# Patient Record
Sex: Female | Born: 1955 | Race: White | Hispanic: No | Marital: Single | Smoking: Former smoker
Health system: Southern US, Community
[De-identification: ages and names within clinical notes are randomized; demographics above are authoritative.]

## PROBLEM LIST (undated history)

## (undated) DIAGNOSIS — J189 Pneumonia, unspecified organism: Secondary | ICD-10-CM

## (undated) DIAGNOSIS — R42 Dizziness and giddiness: Secondary | ICD-10-CM

## (undated) DIAGNOSIS — J449 Chronic obstructive pulmonary disease, unspecified: Secondary | ICD-10-CM

## (undated) DIAGNOSIS — E785 Hyperlipidemia, unspecified: Secondary | ICD-10-CM

## (undated) DIAGNOSIS — S5292XA Unspecified fracture of left forearm, initial encounter for closed fracture: Secondary | ICD-10-CM

## (undated) DIAGNOSIS — E119 Type 2 diabetes mellitus without complications: Secondary | ICD-10-CM

## (undated) DIAGNOSIS — I10 Essential (primary) hypertension: Secondary | ICD-10-CM

## (undated) DIAGNOSIS — M1990 Unspecified osteoarthritis, unspecified site: Secondary | ICD-10-CM

## (undated) DIAGNOSIS — J40 Bronchitis, not specified as acute or chronic: Secondary | ICD-10-CM

## (undated) DIAGNOSIS — C801 Malignant (primary) neoplasm, unspecified: Secondary | ICD-10-CM

## (undated) DIAGNOSIS — E8801 Alpha-1-antitrypsin deficiency: Secondary | ICD-10-CM

## (undated) HISTORY — DX: Alpha-1-antitrypsin deficiency: E88.01

## (undated) HISTORY — PX: BREAST EXCISIONAL BIOPSY: SUR124

## (undated) HISTORY — DX: Malignant (primary) neoplasm, unspecified: C80.1

## (undated) HISTORY — PX: DILATION AND CURETTAGE OF UTERUS: SHX78

---

## 1998-03-27 HISTORY — PX: CARDIAC CATHETERIZATION: SHX172

## 2000-03-27 HISTORY — PX: LUNG TRANSPLANT, DOUBLE: SHX704

## 2000-06-22 DIAGNOSIS — Z942 Lung transplant status: Secondary | ICD-10-CM | POA: Insufficient documentation

## 2011-05-26 ENCOUNTER — Encounter (HOSPITAL_COMMUNITY): Payer: Self-pay | Admitting: Respiratory Therapy

## 2011-05-26 HISTORY — PX: WRIST SURGERY: SHX841

## 2011-05-29 NOTE — Pre-Procedure Instructions (Signed)
20 Krista Ellison  05/29/2011   Your procedure is scheduled on: Thursday June 01, 2011 at 2:00 pm.  Report to [facility] at  1200 PM.  Call this number if you have problems the morning of surgery: [phone]   Remember:   Do not eat food:After Midnight.  May have clear liquids: up to 4 Hours before arrival.  Clear liquids include soda, tea, black coffee, apple or grape juice, broth.  Take these medicines the morning of surgery with A SIP OF WATER: Zovirax & Bactrim   Do not wear jewelry, make-up or nail polish.  Do not wear lotions, powders, or perfumes. You may wear deodorant.  Do not shave 48 hours prior to surgery.  Do not bring valuables to the hospital.  Contacts, dentures or bridgework may not be worn into surgery.  Leave suitcase in the car. After surgery it may be brought to your room.  For patients admitted to the hospital, checkout time is 11:00 AM the day of discharge.   Patients discharged the day of surgery will not be allowed to drive home.  Name and phone number of your driver:   Special Instructions: CHG Shower Use Special Wash: 1/2 bottle night before surgery and 1/2 bottle morning of surgery.   Please read over the following fact sheets that you were given: Pain Booklet, Coughing and Deep Breathing and Surgical Site Infection Prevention

## 2011-05-29 NOTE — Progress Notes (Signed)
Dr. Glenna Durand office called and informed that pt did not have any orders for tomorrow's PAT visit.

## 2011-05-30 ENCOUNTER — Encounter (HOSPITAL_COMMUNITY): Payer: Self-pay

## 2011-05-30 ENCOUNTER — Other Ambulatory Visit: Payer: Self-pay

## 2011-05-30 ENCOUNTER — Ambulatory Visit (HOSPITAL_COMMUNITY)
Admission: RE | Admit: 2011-05-30 | Discharge: 2011-05-30 | Disposition: A | Discharge status: Home / Self Care | Payer: Medicare Other | Source: Ambulatory Visit | Attending: Anesthesiology | Admitting: Anesthesiology

## 2011-05-30 ENCOUNTER — Encounter (HOSPITAL_COMMUNITY)
Admission: RE | Admit: 2011-05-30 | Discharge: 2011-05-30 | Disposition: A | Discharge status: Home / Self Care | Payer: Medicare Other | Source: Ambulatory Visit | Attending: Orthopedic Surgery | Admitting: Orthopedic Surgery

## 2011-05-30 DIAGNOSIS — Z01818 Encounter for other preprocedural examination: Secondary | ICD-10-CM | POA: Insufficient documentation

## 2011-05-30 DIAGNOSIS — Z01810 Encounter for preprocedural cardiovascular examination: Secondary | ICD-10-CM | POA: Insufficient documentation

## 2011-05-30 DIAGNOSIS — Z01812 Encounter for preprocedural laboratory examination: Secondary | ICD-10-CM | POA: Insufficient documentation

## 2011-05-30 HISTORY — DX: Dizziness and giddiness: R42

## 2011-05-30 HISTORY — DX: Chronic obstructive pulmonary disease, unspecified: J44.9

## 2011-05-30 HISTORY — DX: Essential (primary) hypertension: I10

## 2011-05-30 HISTORY — DX: Hyperlipidemia, unspecified: E78.5

## 2011-05-30 HISTORY — DX: Bronchitis, not specified as acute or chronic: J40

## 2011-05-30 HISTORY — DX: Unspecified osteoarthritis, unspecified site: M19.90

## 2011-05-30 HISTORY — DX: Pneumonia, unspecified organism: J18.9

## 2011-05-30 LAB — CBC
HCT: 38.2 % (ref 36.0–46.0)
MCV: 89.7 fL (ref 78.0–100.0)
RBC: 4.26 MIL/uL (ref 3.87–5.11)
RDW: 13.2 % (ref 11.5–15.5)
WBC: 8.4 10*3/uL (ref 4.0–10.5)

## 2011-05-30 LAB — BASIC METABOLIC PANEL
CO2: 27 mEq/L (ref 19–32)
Chloride: 106 mEq/L (ref 96–112)
GFR calc Af Amer: 70 mL/min — ABNORMAL LOW (ref >90)
Potassium: 4.9 mEq/L (ref 3.5–5.1)
Sodium: 139 mEq/L (ref 135–145)

## 2011-05-30 LAB — SURGICAL PCR SCREEN: Staphylococcus aureus: NEGATIVE

## 2011-05-30 NOTE — Progress Notes (Addendum)
Pt informed Nurse that a stress test and cardiac cath was performed in 2000 prior to double lung transplant in [place]. Records requested. Pt also stated that she currently moved to [facility] in September and has a scheduled upcoming appointment at [facility] with a Cardiologist. Consent not signed because there were no orders from surgeon's office after two requests. Pt informed of this and instructed that consent would be signed the morning of surgery. Pt verbalized understanding.

## 2011-05-31 NOTE — Consult Note (Signed)
Anesthesia:  Patient is a 56 year old female posted for an ORIF of a left radial fracture using regional anesthesia on 06/01/11 (orders till pending).  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  She recently moved to [facility] in September 2012.  According to her PAT RN, patient is scheduled to be seen at [facility] (?Transplant Clinic) in March.  I was not asked to see this patient during her PAT visit.  Medications include acyclovir, Vit D, cyclosporine, Omega 3, lisinopril, cellcept, pravachol, prednisone, Septra 400-80, Vit C, and Vit E.  She had a normal spirometry study on 10/25/10 (see attached copy); however, there are no recent office notes.    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  Labs noted.  Will order HFP and coags for the day of surgery since she has a history of alpha-I antitrypsin deficiency.    CXR showed no active cardiopulmonary disease.  RA O2 sats 95%.  EKG shows NSR.  I have not been able to reach the patient yet this morning by telephone to further assess her functional status, but will try again later on today.  (Per her PAT nurse, the patient fell and fractured her wrist while hiking, so would presume she has a fairly good functional status.)  If she is not having any significant respiratory symptoms and additional labs results are reasonable, then anticipate she can proceed with plans for regional anesthesia.  I reviewed this case with several Anesthesiologists including Dr. Michelle Piper who agrees with the above plan but would also recommend a preoperative ABG on RA.

## 2011-05-31 NOTE — Progress Notes (Signed)
REREQUESTED ORDERS FROM DR [facility] OFFICE.

## 2011-06-01 ENCOUNTER — Ambulatory Visit (HOSPITAL_COMMUNITY)
Admission: RE | Admit: 2011-06-01 | Discharge: 2011-06-03 | Disposition: A | Discharge status: Home Health | Payer: Medicare Other | Source: Ambulatory Visit | Attending: Orthopedic Surgery | Admitting: Orthopedic Surgery

## 2011-06-01 ENCOUNTER — Encounter (HOSPITAL_COMMUNITY): Payer: Self-pay | Admitting: Vascular Surgery

## 2011-06-01 ENCOUNTER — Encounter (HOSPITAL_COMMUNITY): Payer: Self-pay | Admitting: *Deleted

## 2011-06-01 ENCOUNTER — Ambulatory Visit (HOSPITAL_COMMUNITY): Payer: Medicare Other | Admitting: Vascular Surgery

## 2011-06-01 ENCOUNTER — Encounter (HOSPITAL_COMMUNITY)
Admission: RE | Disposition: A | Discharge status: Home Health | Payer: Self-pay | Source: Ambulatory Visit | Attending: Orthopedic Surgery

## 2011-06-01 DIAGNOSIS — I10 Essential (primary) hypertension: Secondary | ICD-10-CM | POA: Insufficient documentation

## 2011-06-01 DIAGNOSIS — Z942 Lung transplant status: Secondary | ICD-10-CM | POA: Insufficient documentation

## 2011-06-01 DIAGNOSIS — W19XXXA Unspecified fall, initial encounter: Secondary | ICD-10-CM | POA: Insufficient documentation

## 2011-06-01 DIAGNOSIS — E785 Hyperlipidemia, unspecified: Secondary | ICD-10-CM | POA: Insufficient documentation

## 2011-06-01 DIAGNOSIS — J449 Chronic obstructive pulmonary disease, unspecified: Secondary | ICD-10-CM | POA: Insufficient documentation

## 2011-06-01 DIAGNOSIS — S52599A Other fractures of lower end of unspecified radius, initial encounter for closed fracture: Secondary | ICD-10-CM | POA: Insufficient documentation

## 2011-06-01 DIAGNOSIS — J4489 Other specified chronic obstructive pulmonary disease: Secondary | ICD-10-CM | POA: Insufficient documentation

## 2011-06-01 LAB — BLOOD GAS, ARTERIAL
Acid-base deficit: 2.7 mmol/L — ABNORMAL HIGH (ref 0.0–2.0)
Bicarbonate: 20.1 mEq/L (ref 20.0–24.0)
TCO2: 20.9 mmol/L (ref 0–100)
pCO2 arterial: 26.4 mmHg — ABNORMAL LOW (ref 35.0–45.0)
pH, Arterial: 7.495 — ABNORMAL HIGH (ref 7.350–7.400)

## 2011-06-01 LAB — HEPATIC FUNCTION PANEL
ALT: 11 U/L (ref 0–35)
AST: 16 U/L (ref 0–37)
Albumin: 4 g/dL (ref 3.5–5.2)
Bilirubin, Direct: 0.1 mg/dL (ref 0.0–0.3)
Total Bilirubin: 0.5 mg/dL (ref 0.3–1.2)

## 2011-06-01 LAB — PROTIME-INR: INR: 0.9 (ref 0.00–1.49)

## 2011-06-01 SURGERY — OPEN REDUCTION INTERNAL FIXATION (ORIF) DISTAL RADIUS FRACTURE
Anesthesia: General | Site: Arm Lower | Laterality: Left | Wound class: Clean

## 2011-06-01 MED ORDER — MORPHINE SULFATE 2 MG/ML IJ SOLN: 0.0500 mg/kg | INTRAMUSCULAR | Status: DC | PRN

## 2011-06-01 MED ORDER — MIDAZOLAM HCL 2 MG/2ML IJ SOLN
1.0000 mg | INTRAMUSCULAR | Status: DC | PRN
  Administered 2011-06-01: 2 mg via INTRAVENOUS

## 2011-06-01 MED ORDER — VITAMIN C 500 MG PO TABS
1000.0000 mg | ORAL_TABLET | Freq: Every day | ORAL | Status: DC
  Administered 2011-06-02 – 2011-06-03 (×2): 1000 mg via ORAL
  Filled 2011-06-01 (×3): qty 2

## 2011-06-01 MED ORDER — DOCUSATE SODIUM 100 MG PO CAPS: 100.0000 mg | ORAL_CAPSULE | Freq: Two times a day (BID) | ORAL | Status: AC

## 2011-06-01 MED ORDER — LACTATED RINGERS IV SOLN
INTRAVENOUS | Status: DC
  Administered 2011-06-01: 14:00:00 via INTRAVENOUS

## 2011-06-01 MED ORDER — CEFAZOLIN SODIUM 1-5 GM-% IV SOLN
1.0000 g | Freq: Three times a day (TID) | INTRAVENOUS | Status: AC
  Administered 2011-06-02 (×3): 1 g via INTRAVENOUS
  Filled 2011-06-01 (×3): qty 50

## 2011-06-01 MED ORDER — LIDOCAINE HCL (CARDIAC) 20 MG/ML IV SOLN
INTRAVENOUS | Status: DC | PRN
  Administered 2011-06-01: 60 mg via INTRAVENOUS

## 2011-06-01 MED ORDER — ONDANSETRON HCL 4 MG/2ML IJ SOLN
INTRAMUSCULAR | Status: DC | PRN
  Administered 2011-06-01: 4 mg via INTRAVENOUS

## 2011-06-01 MED ORDER — VITAMIN D3 25 MCG (1000 UNIT) PO TABS
2000.0000 [IU] | ORAL_TABLET | Freq: Every day | ORAL | Status: DC
  Administered 2011-06-02 – 2011-06-03 (×2): 2000 [IU] via ORAL
  Filled 2011-06-01 (×3): qty 2

## 2011-06-01 MED ORDER — DOCUSATE SODIUM 100 MG PO CAPS
100.0000 mg | ORAL_CAPSULE | Freq: Two times a day (BID) | ORAL | Status: DC
  Administered 2011-06-02 – 2011-06-03 (×4): 100 mg via ORAL
  Filled 2011-06-01 (×5): qty 1

## 2011-06-01 MED ORDER — SULFAMETHOXAZOLE-TRIMETHOPRIM 400-80 MG PO TABS
1.0000 | ORAL_TABLET | ORAL | Status: DC
  Administered 2011-06-02: 1 via ORAL
  Filled 2011-06-01: qty 1

## 2011-06-01 MED ORDER — MORPHINE SULFATE 2 MG/ML IJ SOLN
1.0000 mg | INTRAMUSCULAR | Status: DC | PRN
  Administered 2011-06-02 (×4): 1 mg via INTRAVENOUS
  Filled 2011-06-01 (×4): qty 1

## 2011-06-01 MED ORDER — METHOCARBAMOL 500 MG PO TABS: 500.0000 mg | ORAL_TABLET | Freq: Four times a day (QID) | ORAL | Status: AC

## 2011-06-01 MED ORDER — MYCOPHENOLATE MOFETIL 500 MG PO TABS
1500.0000 mg | ORAL_TABLET | Freq: Two times a day (BID) | ORAL | Status: DC
  Administered 2011-06-02: 1500 mg via ORAL
  Filled 2011-06-01 (×7): qty 3

## 2011-06-01 MED ORDER — POTASSIUM CL IN DEXTROSE 5% 20 MEQ/L IV SOLN
20.0000 meq | INTRAVENOUS | Status: DC
  Administered 2011-06-02 (×2): 20 meq via INTRAVENOUS
  Filled 2011-06-01 (×4): qty 1000

## 2011-06-01 MED ORDER — LISINOPRIL 10 MG PO TABS
10.0000 mg | ORAL_TABLET | Freq: Every day | ORAL | Status: DC
  Administered 2011-06-01 – 2011-06-03 (×3): 10 mg via ORAL
  Filled 2011-06-01 (×3): qty 1

## 2011-06-01 MED ORDER — ONDANSETRON HCL 4 MG PO TABS: 4.0000 mg | ORAL_TABLET | Freq: Four times a day (QID) | ORAL | Status: DC | PRN

## 2011-06-01 MED ORDER — FENTANYL CITRATE 0.05 MG/ML IJ SOLN
INTRAMUSCULAR | Status: AC
  Filled 2011-06-01: qty 2

## 2011-06-01 MED ORDER — CEFAZOLIN SODIUM 1-5 GM-% IV SOLN
1.0000 g | INTRAVENOUS | Status: AC
  Administered 2011-06-01: 1 g via INTRAVENOUS
  Filled 2011-06-01: qty 50

## 2011-06-01 MED ORDER — HYDROCODONE-ACETAMINOPHEN 5-325 MG PO TABS
1.0000 | ORAL_TABLET | ORAL | Status: DC | PRN
  Administered 2011-06-03 (×2): 2 via ORAL
  Filled 2011-06-01 (×2): qty 2

## 2011-06-01 MED ORDER — CEFAZOLIN SODIUM 1-5 GM-% IV SOLN
INTRAVENOUS | Status: AC
  Administered 2011-06-02: 1 g via INTRAVENOUS
  Filled 2011-06-01: qty 50

## 2011-06-01 MED ORDER — CYCLOSPORINE MODIFIED 50 MG PO CAPS (GENGRAF ONLY)
150.0000 mg | Freq: Two times a day (BID) | ORAL | Status: DC
  Administered 2011-06-01 – 2011-06-03 (×5): 150 mg via ORAL
  Filled 2011-06-01 (×7): qty 3

## 2011-06-01 MED ORDER — OXYCODONE-ACETAMINOPHEN 5-325 MG PO TABS: 1.0000 | ORAL_TABLET | ORAL | Status: AC | PRN

## 2011-06-01 MED ORDER — HYDROCORTISONE SOD SUCCINATE 100 MG PF FOR IT USE
INTRAMUSCULAR | Status: DC | PRN
  Administered 2011-06-01: 100 mg via INTRATHECAL

## 2011-06-01 MED ORDER — SIMVASTATIN 5 MG PO TABS
5.0000 mg | ORAL_TABLET | Freq: Every day | ORAL | Status: DC
  Administered 2011-06-02: 5 mg via ORAL
  Filled 2011-06-01 (×3): qty 1

## 2011-06-01 MED ORDER — ONDANSETRON HCL 4 MG/2ML IJ SOLN: 4.0000 mg | Freq: Once | INTRAMUSCULAR | Status: DC | PRN

## 2011-06-01 MED ORDER — CEFAZOLIN SODIUM 1-5 GM-% IV SOLN: 1.0000 g | Freq: Three times a day (TID) | INTRAVENOUS | Status: DC

## 2011-06-01 MED ORDER — FENTANYL CITRATE 0.05 MG/ML IJ SOLN
INTRAMUSCULAR | Status: DC | PRN
  Administered 2011-06-01: 50 ug via INTRAVENOUS
  Administered 2011-06-01: 100 ug via INTRAVENOUS
  Administered 2011-06-01: 50 ug via INTRAVENOUS

## 2011-06-01 MED ORDER — LACTATED RINGERS IV SOLN
INTRAVENOUS | Status: DC | PRN
  Administered 2011-06-01 (×2): via INTRAVENOUS

## 2011-06-01 MED ORDER — MIDAZOLAM HCL 2 MG/2ML IJ SOLN
INTRAMUSCULAR | Status: AC
  Filled 2011-06-01: qty 2

## 2011-06-01 MED ORDER — METHOCARBAMOL 100 MG/ML IJ SOLN
500.0000 mg | Freq: Four times a day (QID) | INTRAVENOUS | Status: DC | PRN
  Filled 2011-06-01: qty 5

## 2011-06-01 MED ORDER — ONDANSETRON HCL 4 MG/2ML IJ SOLN
4.0000 mg | Freq: Four times a day (QID) | INTRAMUSCULAR | Status: DC | PRN
  Administered 2011-06-02: 4 mg via INTRAVENOUS
  Filled 2011-06-01: qty 2

## 2011-06-01 MED ORDER — VITAMIN E 180 MG (400 UNIT) PO CAPS
400.0000 [IU] | ORAL_CAPSULE | Freq: Every day | ORAL | Status: DC
  Administered 2011-06-02 – 2011-06-03 (×2): 400 [IU] via ORAL
  Filled 2011-06-01 (×3): qty 1

## 2011-06-01 MED ORDER — METHOCARBAMOL 100 MG/ML IJ SOLN
500.0000 mg | INTRAMUSCULAR | Status: AC
  Administered 2011-06-01: 500 mg via INTRAVENOUS
  Filled 2011-06-01: qty 5

## 2011-06-01 MED ORDER — CEFAZOLIN SODIUM 1-5 GM-% IV SOLN
1.0000 g | INTRAVENOUS | Status: AC
  Administered 2011-06-01: 1 g via INTRAVENOUS

## 2011-06-01 MED ORDER — VITAMIN C 500 MG PO TABS
500.0000 mg | ORAL_TABLET | Freq: Every day | ORAL | Status: DC
  Administered 2011-06-03: 500 mg via ORAL
  Filled 2011-06-01 (×3): qty 1

## 2011-06-01 MED ORDER — ZOLPIDEM TARTRATE 5 MG PO TABS: 5.0000 mg | ORAL_TABLET | Freq: Every evening | ORAL | Status: DC | PRN

## 2011-06-01 MED ORDER — PREDNISONE 5 MG PO TABS
5.0000 mg | ORAL_TABLET | Freq: Every day | ORAL | Status: DC
  Administered 2011-06-02 – 2011-06-03 (×2): 5 mg via ORAL
  Filled 2011-06-01 (×3): qty 1

## 2011-06-01 MED ORDER — MIDAZOLAM HCL 5 MG/5ML IJ SOLN
INTRAMUSCULAR | Status: DC | PRN
  Administered 2011-06-01: 2 mg via INTRAVENOUS

## 2011-06-01 MED ORDER — OXYCODONE HCL 5 MG PO TABS
5.0000 mg | ORAL_TABLET | ORAL | Status: DC | PRN
  Administered 2011-06-01: 10 mg via ORAL
  Administered 2011-06-01: 5 mg via ORAL
  Administered 2011-06-02 (×3): 10 mg via ORAL
  Filled 2011-06-01 (×6): qty 2

## 2011-06-01 MED ORDER — FENTANYL CITRATE 0.05 MG/ML IJ SOLN
50.0000 ug | INTRAMUSCULAR | Status: DC | PRN
  Administered 2011-06-01: 50 ug via INTRAVENOUS
  Administered 2011-06-01: 100 ug via INTRAVENOUS

## 2011-06-01 MED ORDER — FENTANYL CITRATE 0.05 MG/ML IJ SOLN
50.0000 ug | Freq: Once | INTRAMUSCULAR | Status: AC
  Administered 2011-06-01: 50 ug via INTRAVENOUS

## 2011-06-01 MED ORDER — BUPIVACAINE-EPINEPHRINE PF 0.5-1:200000 % IJ SOLN
INTRAMUSCULAR | Status: DC | PRN
  Administered 2011-06-01: 25 mL

## 2011-06-01 MED ORDER — ACYCLOVIR 400 MG PO TABS
400.0000 mg | ORAL_TABLET | Freq: Every day | ORAL | Status: DC
  Administered 2011-06-02 – 2011-06-03 (×2): 400 mg via ORAL
  Filled 2011-06-01 (×3): qty 1

## 2011-06-01 MED ORDER — CEFAZOLIN SODIUM 1-5 GM-% IV SOLN
1.0000 g | Freq: Three times a day (TID) | INTRAVENOUS | Status: DC
  Administered 2011-06-01 – 2011-06-03 (×3): 1 g via INTRAVENOUS
  Filled 2011-06-01 (×8): qty 50

## 2011-06-01 MED ORDER — FENTANYL CITRATE 0.05 MG/ML IJ SOLN: 50.0000 ug | Freq: Once | INTRAMUSCULAR | Status: DC

## 2011-06-01 MED ORDER — PROPOFOL 10 MG/ML IV EMUL
INTRAVENOUS | Status: DC | PRN
  Administered 2011-06-01: 200 mg via INTRAVENOUS

## 2011-06-01 MED ORDER — VITAMIN C 500 MG PO TABS: 500.0000 mg | ORAL_TABLET | Freq: Two times a day (BID) | ORAL | Status: DC

## 2011-06-01 MED ORDER — ALPRAZOLAM 0.5 MG PO TABS: 0.5000 mg | ORAL_TABLET | Freq: Four times a day (QID) | ORAL | Status: DC | PRN

## 2011-06-01 MED ORDER — DIPHENHYDRAMINE HCL 25 MG PO CAPS: 25.0000 mg | ORAL_CAPSULE | Freq: Four times a day (QID) | ORAL | Status: DC | PRN

## 2011-06-01 MED ORDER — HYDROMORPHONE HCL PF 1 MG/ML IJ SOLN: 0.2500 mg | INTRAMUSCULAR | Status: DC | PRN

## 2011-06-01 MED ORDER — CHLORHEXIDINE GLUCONATE 4 % EX LIQD
60.0000 mL | Freq: Once | CUTANEOUS | Status: DC
  Filled 2011-06-01: qty 60

## 2011-06-01 MED ORDER — METHOCARBAMOL 500 MG PO TABS
500.0000 mg | ORAL_TABLET | Freq: Four times a day (QID) | ORAL | Status: DC | PRN
  Administered 2011-06-01 – 2011-06-03 (×5): 500 mg via ORAL
  Filled 2011-06-01 (×5): qty 1

## 2011-06-01 MED ORDER — ADULT MULTIVITAMIN W/MINERALS CH
1.0000 | ORAL_TABLET | Freq: Every day | ORAL | Status: DC
  Administered 2011-06-02 – 2011-06-03 (×2): 1 via ORAL
  Filled 2011-06-01 (×3): qty 1

## 2011-06-01 SURGICAL SUPPLY — 65 items
BANDAGE ACE 4 STERILE (GAUZE/BANDAGES/DRESSINGS) ×2 IMPLANT
BANDAGE ELASTIC 3 VELCRO ST LF (GAUZE/BANDAGES/DRESSINGS) ×2 IMPLANT
BANDAGE ELASTIC 4 VELCRO ST LF (GAUZE/BANDAGES/DRESSINGS) ×2 IMPLANT
BANDAGE GAUZE ELAST BULKY 4 IN (GAUZE/BANDAGES/DRESSINGS) ×2 IMPLANT
BIT DRILL 2 FAST STEP (BIT) ×2 IMPLANT
BIT DRILL 2.5X4 QC (BIT) ×2 IMPLANT
BLADE SURG ROTATE 9660 (MISCELLANEOUS) IMPLANT
BNDG ESMARK 4X9 LF (GAUZE/BANDAGES/DRESSINGS) ×2 IMPLANT
CLOTH BEACON ORANGE TIMEOUT ST (SAFETY) ×2 IMPLANT
CORDS BIPOLAR (ELECTRODE) ×2 IMPLANT
COVER SURGICAL LIGHT HANDLE (MISCELLANEOUS) ×2 IMPLANT
CUFF TOURNIQUET SINGLE 18IN (TOURNIQUET CUFF) ×2 IMPLANT
CUFF TOURNIQUET SINGLE 24IN (TOURNIQUET CUFF) IMPLANT
DRAIN TLS ROUND 10FR (DRAIN) IMPLANT
DRAPE OEC MINIVIEW 54X84 (DRAPES) ×2 IMPLANT
DRAPE SURG 17X11 SM STRL (DRAPES) ×2 IMPLANT
DRSG ADAPTIC 3X8 NADH LF (GAUZE/BANDAGES/DRESSINGS) ×2 IMPLANT
ELECT REM PT RETURN 9FT ADLT (ELECTROSURGICAL)
ELECTRODE REM PT RTRN 9FT ADLT (ELECTROSURGICAL) IMPLANT
GAUZE SPONGE 4X4 12PLY STRL LF (GAUZE/BANDAGES/DRESSINGS) ×2 IMPLANT
GAUZE SPONGE 4X4 16PLY XRAY LF (GAUZE/BANDAGES/DRESSINGS) ×2 IMPLANT
GLOVE BIOGEL PI IND STRL 8.5 (GLOVE) ×1 IMPLANT
GLOVE BIOGEL PI INDICATOR 8.5 (GLOVE) ×1
GLOVE SURG ORTHO 8.0 STRL STRW (GLOVE) ×2 IMPLANT
GOWN PREVENTION PLUS XLARGE (GOWN DISPOSABLE) ×2 IMPLANT
GOWN STRL NON-REIN LRG LVL3 (GOWN DISPOSABLE) ×6 IMPLANT
K-WIRE 1.6 (WIRE) ×1
K-WIRE FX5X1.6XNS BN SS (WIRE) ×1
KIT BASIN OR (CUSTOM PROCEDURE TRAY) ×2 IMPLANT
KIT ROOM TURNOVER OR (KITS) ×2 IMPLANT
KWIRE FX5X1.6XNS BN SS (WIRE) ×1 IMPLANT
MANIFOLD NEPTUNE II (INSTRUMENTS) ×2 IMPLANT
NEEDLE HYPO 25X1 1.5 SAFETY (NEEDLE) ×2 IMPLANT
NS IRRIG 1000ML POUR BTL (IV SOLUTION) ×2 IMPLANT
PACK ORTHO EXTREMITY (CUSTOM PROCEDURE TRAY) ×2 IMPLANT
PAD ARMBOARD 7.5X6 YLW CONV (MISCELLANEOUS) ×4 IMPLANT
PAD CAST 3X4 CTTN HI CHSV (CAST SUPPLIES) ×1 IMPLANT
PAD CAST 4YDX4 CTTN HI CHSV (CAST SUPPLIES) ×1 IMPLANT
PADDING CAST COTTON 3X4 STRL (CAST SUPPLIES) ×1
PADDING CAST COTTON 4X4 STRL (CAST SUPPLIES) ×1
PEG SUBCHONDRAL SMOOTH 2.0X18 (Peg) ×8 IMPLANT
PEG SUBCHONDRAL SMOOTH 2.0X20 (Peg) ×2 IMPLANT
PEG SUBCHONDRAL SMOOTH 2.0X22 (Peg) ×2 IMPLANT
PLATE SHORT 24.4X51.3 LT (Plate) ×2 IMPLANT
PUTTY ORTHOBLAST II 5CC (Orthopedic Implant) ×2 IMPLANT
SCREW BN 12X3.5XNS CORT TI (Screw) ×1 IMPLANT
SCREW CORT 3.5X12 (Screw) ×1 IMPLANT
SCREW PEG 2.5X20 NONLOCK (Screw) ×2 IMPLANT
SOAP 2 % CHG 4 OZ (WOUND CARE) ×2 IMPLANT
SPLINT PLASTER 3X15 (CAST SUPPLIES) ×2 IMPLANT
SPONGE GAUZE 4X4 12PLY (GAUZE/BANDAGES/DRESSINGS) ×2 IMPLANT
SPONGE LAP 4X18 X RAY DECT (DISPOSABLE) ×2 IMPLANT
STRIP CLOSURE SKIN 1/2X4 (GAUZE/BANDAGES/DRESSINGS) IMPLANT
SUCTION FRAZIER TIP 10 FR DISP (SUCTIONS) ×2 IMPLANT
SUT ETHILON 4 0 PS 2 18 (SUTURE) ×2 IMPLANT
SUT MNCRL AB 4-0 PS2 18 (SUTURE) IMPLANT
SUT VIC AB 2-0 FS1 27 (SUTURE) ×2 IMPLANT
SUT VICRYL 4-0 PS2 18IN ABS (SUTURE) ×2 IMPLANT
SYR CONTROL 10ML LL (SYRINGE) IMPLANT
SYSTEM CHEST DRAIN TLS 7FR (DRAIN) IMPLANT
TOWEL OR 17X24 6PK STRL BLUE (TOWEL DISPOSABLE) ×2 IMPLANT
TOWEL OR 17X26 10 PK STRL BLUE (TOWEL DISPOSABLE) ×2 IMPLANT
TUBE CONNECTING 12X1/4 (SUCTIONS) ×2 IMPLANT
WATER STERILE IRR 1000ML POUR (IV SOLUTION) ×2 IMPLANT
YANKAUER SUCT BULB TIP NO VENT (SUCTIONS) IMPLANT

## 2011-06-01 NOTE — Transfer of Care (Signed)
Immediate Anesthesia Transfer of Care Note  Patient: Krista Ellison  Procedure(s) Performed: Procedure(s) (LRB): OPEN REDUCTION INTERNAL FIXATION (ORIF) DISTAL RADIAL FRACTURE (Left)  Patient Location: PACU  Anesthesia Type: General  Level of Consciousness: awake, alert , oriented and sedated  Airway & Oxygen Therapy: Patient Spontanous Breathing and Patient connected to nasal cannula oxygen  Post-op Assessment: Report given to PACU RN, Post -op Vital signs reviewed and stable and Patient moving all extremities  Post vital signs: Reviewed and stable  Complications: No apparent anesthesia complications

## 2011-06-01 NOTE — Anesthesia Procedure Notes (Signed)
Anesthesia Regional Block:  Supraclavicular block  Pre-Anesthetic Checklist: ,, timeout performed, Correct Patient, Correct Site, Correct Laterality, Correct Procedure, Correct Position, site marked, Risks and benefits discussed,  Surgical consent,  Pre-op evaluation,  At surgeon's request and post-op pain management  Laterality: Left and Upper  Prep: chloraprep       Needles:  Injection technique: Single-shot  Needle Type: Echogenic Needle     Needle Length: 5cm 5 cm Needle Gauge: 22 and 22 G    Additional Needles:  Procedures: ultrasound guided Supraclavicular block Narrative:  Start time: 06/01/2011 1:51 PM End time: 06/01/2011 2:02 PM Injection made incrementally with aspirations every 5 mL.  Performed by: Personally  Anesthesiologist: Sheldon Silvan MD  Interscalene brachial plexus block

## 2011-06-01 NOTE — Anesthesia Postprocedure Evaluation (Signed)
  Anesthesia Post-op Note  Patient: Krista Ellison  Procedure(s) Performed: Procedure(s) (LRB): OPEN REDUCTION INTERNAL FIXATION (ORIF) DISTAL RADIAL FRACTURE (Left)  Patient Location: PACU  Anesthesia Type: General  Level of Consciousness: awake, alert  and oriented  Airway and Oxygen Therapy: Patient Spontanous Breathing and Patient connected to nasal cannula oxygen  Post-op Pain: mild  Post-op Assessment: Post-op Vital signs reviewed and Patient's Cardiovascular Status Stable  Post-op Vital Signs: stable  Complications: No apparent anesthesia complications

## 2011-06-01 NOTE — Anesthesia Preprocedure Evaluation (Signed)
Anesthesia Evaluation  Patient identified by MRN, date of birth, ID band Patient awake    Reviewed: Allergy & Precautions, H&P , NPO status , Patient's Chart, lab work & pertinent test results  Airway Mallampati: I TM Distance: >3 FB Neck ROM: Full    Dental   Pulmonary  breath sounds clear to auscultation Hx of bilateral lung transplant 2002. Doing well.        Cardiovascular Rhythm:Regular Rate:Normal     Neuro/Psych    GI/Hepatic   Endo/Other    Renal/GU      Musculoskeletal   Abdominal   Peds  Hematology   Anesthesia Other Findings   Reproductive/Obstetrics                           Anesthesia Physical Anesthesia Plan  ASA: III  Anesthesia Plan: General   Post-op Pain Management:    Induction: Intravenous  Airway Management Planned: LMA  Additional Equipment:   Intra-op Plan:   Post-operative Plan: Extubation in OR  Informed Consent:   Dental advisory given  Plan Discussed with: CRNA and Surgeon  Anesthesia Plan Comments:         Anesthesia Quick Evaluation

## 2011-06-01 NOTE — H&P (Signed)
Krista Ellison is an 56 y.o. female.   Chief Complaint: FALL ON OUTSTRETCHED LEFT WRIST HPI: PT FELL ON LEFT WRIST CONTINUED DISPLACEMENT AFTER CLOSED TREATMENT PT ELECTS SURGERY TO CORRECT DISPLACED FRACTURE HERE FOR SURGERY  Past Medical History  Diagnosis Date  . Hypertension     Takes Lisinopril  . Hyperlipidemia     takes Pravastatin  . COPD (chronic obstructive pulmonary disease)     hx of. Had lung transplant  . Bronchitis     hx of  . Pneumonia     hx of  . Dizziness   . Arthritis     Past Surgical History  Procedure Date  . Lung transplant, double 2002  . Dilation and curettage of uterus   . Cardiac catheterization 2000    Clean cath    Family History  Problem Relation Age of Onset  . Anesthesia problems Neg Hx   . Hypotension Neg Hx   . Pseudochol deficiency Neg Hx   . Malignant hyperthermia Neg Hx    Social History:  reports that she has quit smoking. She does not have any smokeless tobacco history on file. She reports that she drinks alcohol. She reports that she does not use illicit drugs.  Allergies: No Known Allergies  Medications Prior to Admission  Medication Dose Route Frequency Provider Last Rate Last Dose  . ceFAZolin (ANCEF) 1-5 GM-% IVPB           . ceFAZolin (ANCEF) IVPB 1 g/50 mL premix  1 g Intravenous 60 min Pre-Op Sharma Covert, MD      . chlorhexidine (HIBICLENS) 4 % liquid 4 application  60 mL Topical Once Sharma Covert, MD      . fentaNYL (SUBLIMAZE) injection 50-100 mcg  50-100 mcg Intravenous PRN Kerby Nora, MD   100 mcg at 06/01/11 1347  . lactated ringers infusion   Intravenous Continuous Kerby Nora, MD 50 mL/hr at 06/01/11 1338    . midazolam (VERSED) injection 1-2 mg  1-2 mg Intravenous PRN Kerby Nora, MD   2 mg at 06/01/11 1346   No current outpatient prescriptions on file as of 06/01/2011.    Results for orders placed during the hospital encounter of 06/01/11 (from the past 48 hour(s))  APTT     Status:  Normal   Collection Time   06/01/11 12:30 PM      Component Value Range Comment   aPTT 28  24 - 37 (seconds)   PROTIME-INR     Status: Normal   Collection Time   06/01/11 12:30 PM      Component Value Range Comment   Prothrombin Time 12.3  11.6 - 15.2 (seconds)    INR 0.90  0.00 - 1.49    HEPATIC FUNCTION PANEL     Status: Normal   Collection Time   06/01/11 12:30 PM      Component Value Range Comment   Total Protein 7.0  6.0 - 8.3 (g/dL)    Albumin 4.0  3.5 - 5.2 (g/dL)    AST 16  0 - 37 (U/L)    ALT 11  0 - 35 (U/L)    Alkaline Phosphatase 54  39 - 117 (U/L)    Total Bilirubin 0.5  0.3 - 1.2 (mg/dL)    Bilirubin, Direct 0.1  0.0 - 0.3 (mg/dL)    Indirect Bilirubin 0.4  0.3 - 0.9 (mg/dL)   BLOOD GAS, ARTERIAL     Status: Abnormal   Collection Time  06/01/11 12:30 PM      Component Value Range Comment   FIO2 0.21      pH, Arterial 7.495 (*) 7.350 - 7.400     pCO2 arterial 26.4 (*) 35.0 - 45.0 (mmHg)    pO2, Arterial 128.0 (*) 80.0 - 100.0 (mmHg)    Bicarbonate 20.1  20.0 - 24.0 (mEq/L)    TCO2 20.9  0 - 100 (mmol/L)    Acid-base deficit 2.7 (*) 0.0 - 2.0 (mmol/L)    O2 Saturation 98.9      Patient temperature 98.6      Collection site RIGHT RADIAL      Drawn by [id number]      Sample type ARTERIAL DRAW      Allens test (pass/fail) PASS  PASS     Dg Chest 2 View  05/30/2011  *RADIOLOGY REPORT*  Clinical Data: Preop left arm fracture.  CHEST - 2 VIEW  Comparison: None  Findings: Prior median sternotomy. Heart and mediastinal contours are within normal limits.  No focal opacities or effusions.  No acute bony abnormality.  IMPRESSION: No active cardiopulmonary disease.  Original Report Authenticated By: Cyndie Chime, M.D.    NO RECENT ILLNESSES OR HOSPITALIZATIONS  Blood pressure 132/89, pulse 67, temperature 97.8 F (36.6 C), temperature source Oral, resp. rate 18, last menstrual period 03/02/2010, SpO2 99.00%. General Appearance:  Alert, cooperative, no distress, appears stated  age  Head:  Normocephalic, without obvious abnormality, atraumatic  Eyes:  Pupils equal, conjunctiva/corneas clear,         Throat: Lips, mucosa, and tongue normal; teeth and gums normal  Neck: No visible masses     Lungs:   respirations unlabored  Chest Wall:  No tenderness or deformity  Heart:  Regular rate and rhythm,  Abdomen:   Soft, non-tender,         Extremities: LEFT WRIST: SUGARTONG SPLINT IN PLACE PT RECEIVED BLOCK PRIOR TO MY EXAM FINGERS WARM WELL PERFUSED   Pulses: 2+ and symmetric  Skin: Skin color, texture, turgor normal, no rashes or lesions     Neurologic: Normal    Assessment/Plan Left distal radius fracture displaced  Left distal radius open reduction and internal fixation  R/B/A DISCUSSED WITH PT IN OFFICE.  PT VOICED UNDERSTANDING OF PLAN CONSENT SIGNED DAY OF SURGERY PT SEEN AND EXAMINED PRIOR TO OPERATIVE PROCEDURE/DAY OF SURGERY SITE MARKED. QUESTIONS ANSWERED WILL BE ADMITTED  FOLLOWING SURGERY  Sharma Covert 06/01/2011, 2:10 PM

## 2011-06-01 NOTE — Preoperative (Signed)
Beta Blockers   Reason not to administer Beta Blockers:Not Applicable 

## 2011-06-01 NOTE — Brief Op Note (Signed)
06/01/2011  4:55 PM  PATIENT:  Krista Ellison  56 y.o. female  PRE-OPERATIVE DIAGNOSIS:  left distal radius fracture   POST-OPERATIVE DIAGNOSIS:  left distal radius fracture  PROCEDURE:  Procedure(s) (LRB): OPEN REDUCTION INTERNAL FIXATION (ORIF) DISTAL RADIAL FRACTURE (Left)  SURGEON:  Surgeon(s) and Role:    * Sharma Covert, MD - Primary  PHYSICIAN ASSISTANT:   ASSISTANTS: none   ANESTHESIA:   general  EBL:  Total I/O In: 1200 [I.V.:1200] Out: 15 [Blood:15]  BLOOD ADMINISTERED:none  DRAINS: none   LOCAL MEDICATIONS USED:  NONE  SPECIMEN:  No Specimen  DISPOSITION OF SPECIMEN:  N/A  COUNTS:  YES  TOURNIQUET:   Total Tourniquet Time Documented: Upper Arm (Left) - -22 minutes  DICTATION: .typed in  PLAN OF CARE: Admit for overnight observation  PATIENT DISPOSITION:  PACU - hemodynamically stable.   Delay start of Pharmacological VTE agent (>24hrs) due to surgical blood loss or risk of bleeding: not applicable

## 2011-06-01 NOTE — Op Note (Signed)
PREOPERATIVE DIAGNOSIS: Left wrist intra-articular distal radius  fracture, 3 or more fragments.   POSTOPERATIVE DIAGNOSIS: Left wrist intra-articular distal radius  fracture, 3 or more fragments.   ATTENDING PHYSICIAN: Sharma Covert IV, MD who scrubbed and present  entire procedure.   ASSISTANT SURGEON: None.   ANESTHESIA: Supraclavicular block performed by Dr. Ivin Booty and general  anesthesia.   SURGICAL IMPLANTS: [employer] DVR plate, standard with seven distal locking pegs and 3 bicortical screws proximally Several cc of Orthoblast bone graft substititute  SURGICAL PROCEDURE:  1. Open treatment of left wrist intra-articular distal radius  fracture, 3 or more fragments.   2. Left wrist brachioradialis tenotomy and release.   3. Radiographs, stress radiographs, left wrist.   SURGICAL INDICATIONS: Krista Ellison  is a right-hand-dominant female who sustained an intra-articular distal radius fracture after a fall. The  patient was seen and evaluated in the office based on degree of  displacement and the  displacement, recommended that she undergo  the above procedure. Risks, benefits, and alternatives were discussed  in detail with the patient. Signed informed consent was obtained.  Risks include, but not limited to bleeding, infection, damage to nearby  nerves, arteries, or tendons, nonunion, malunion, hardware failure, loss  of motion of the elbow, wrist, and digits, and need for further surgical  intervention.   PROCEDURE: The patient was properly identified in the preop holding  area. A mark with a permanent marker was made on the left wrist to  indicate correct operative site. The patient tolerated the block  performed by Anesthesia. The patient was then brought back to the  operating room. The patient received preoperative antibiotics. General  anesthesia was induced. Left upper extremity was prepped and draped in  normal sterile fashion. Time-out was called.  Correct site was  identified, and procedure then begun. Attention was then turned to the  left wrist. The limb was then elevated using Esmarch exsanguination and  tourniquet insufflated. A longitudinal incision was made directly over  the FCR sheath. Dissection was then carried down through the skin and  subcutaneous tissue. The FCR sheath was then opened proximally and  distally. Careful dissection was Ellison going through the floor of the  FCR sheath where the FPL was identified. An L-shaped pronator quadratus  flap was then elevated. In order to aid in reduction tenotomy of the brachioradialis was completed with protection of the first dorsal compartment tendons.  The fracture site was then opened and the patient did have several fracture fragment extending into the joint greater than 3 part intra-articular fracture. Pt had a large metaphyseal defect and several cc of Orthoblast bone graft was packed into the defect. Careful open reduction was then carried out. The appropriate size dvr plate was chosen.. The oblong screw hole was then drilled with a 2.5 mm drill bit, then 3.5 mm bicortical screw. Plate height was adjusted.   After position was then confirmed using mini C-arm, the distal row  fixation was then carried out with the beginning from an ulnar to radial  direction with the  locking pegs. The total of 7 locking pegs were then placed. Following this, attention was then turned proximally where 2 more nonlocking screws were then placed in the 2.5 mm drill bit, the 3.5 mm non-locking screws.   The wound was then thoroughly irrigated. Final stress radiography was then carried out.  Stress radiographs were then obtained under live fluoro showing no widening of the SL interval. I did not see any  carpal dissociation with good fixation, without any  evidence of penetration in the articular margin with the locking pegs.    Postop, the pronator quadratus was then closed with 2-0 Vicryl.    Tourniquet was then deflated. Hemostasis was then obtained. The  subcutaneous tissues closed with 4-0 Vicryl and skin closed with simple  nylon sutures. Adaptic dressing and sterile compressive bandage was  then applied. The patient was then placed in a well-padded sugar tong splint. Extubated and taken to recovery room in good condition.    INTRAOPERATIVE RADIOGRAPHS:, 3 views of the wrist do show  the volar plate fixation in place. There is good position in both  planes.    POSTOPERATIVE PLAN: The patient will be admitted for IV antibiotics and  pain control; discharged in the morning. Seen back in the office for  approximately 10-14 days for wound check, suture removal, and then x-  rays, short-arm cast for total 4 weeks, and then begin a therapy regimen  around a 4-week mark. Radiographs at each visit.    Krista Done, MD

## 2011-06-02 MED ORDER — MORPHINE SULFATE 2 MG/ML IJ SOLN: 2.0000 mg | INTRAMUSCULAR | Status: DC | PRN

## 2011-06-02 MED ORDER — HYDROMORPHONE HCL PF 1 MG/ML IJ SOLN
0.5000 mg | INTRAMUSCULAR | Status: DC | PRN
  Administered 2011-06-02 – 2011-06-03 (×4): 1 mg via INTRAVENOUS
  Filled 2011-06-02 (×4): qty 1

## 2011-06-02 MED ORDER — MYCOPHENOLATE MOFETIL 250 MG PO CAPS
1500.0000 mg | ORAL_CAPSULE | Freq: Two times a day (BID) | ORAL | Status: DC
  Administered 2011-06-02 – 2011-06-03 (×2): 1500 mg via ORAL
  Filled 2011-06-02 (×2): qty 6

## 2011-06-02 MED ORDER — PROMETHAZINE HCL 25 MG/ML IJ SOLN
12.5000 mg | Freq: Four times a day (QID) | INTRAMUSCULAR | Status: DC | PRN
  Administered 2011-06-02 – 2011-06-03 (×2): 12.5 mg via INTRAVENOUS
  Filled 2011-06-02 (×2): qty 1

## 2011-06-02 NOTE — Progress Notes (Signed)
Pt pain has been somewhat unmanageable all day. Pt is receiving all ordered pain medications. Pt hand is moderately swollen. Pt has + cap refill with positive movement of fingers. Hand is slightly purple but appears more to be bruising. All rept to MD. Orders received.

## 2011-06-02 NOTE — Progress Notes (Signed)
Pt, after receiving phenergan, elevating LUE higher and applying 3 ice packs, is now resting comfortably. Pt repts pain is "much better". Pt repts "I feel better now than I have felt". Will continue to monitor.

## 2011-06-02 NOTE — Progress Notes (Signed)
Pt is s/p ORIF L wrist due to fracture from fall prior to admission. Pt has an ace cast splint LUE that is dry and intact. Pt currently refuses ice to LUE. L fingers 2+ pitting edema but warm to touch with + cap refill. Pt has good movement of fingers. Pt has LUE elevated on multiple pillows to prevent/minimize edema. Pt ambulates with steady gait. Lungs CTA and heart rate regular rate and rhythm. No s/sx cardiac or resp distress and no c/o such. Vital signs stable. Abdomen soft flat nontender and nondistended. BS+x4. Pt repts passing gas. Pt denies nausea or vomiting. Pt tolerates diet fair. Pt performs IS per order. Pt states that she understands IS. Pt states, "Ive had those before."

## 2011-06-02 NOTE — Progress Notes (Signed)
Rept to Dr. Orlan Leavens regarding pt nausea and vomiting even after Zofran. Orders received.

## 2011-06-02 NOTE — Progress Notes (Signed)
Utilization review completed. Nyaira Hodgens, RN, BSN.  06/02/11  

## 2011-06-03 MED ORDER — HYDROMORPHONE HCL 2 MG PO TABS: 2.0000 mg | ORAL_TABLET | ORAL | Status: AC | PRN

## 2011-06-03 MED ORDER — PROMETHAZINE HCL 50 MG PO TABS: 25.0000 mg | ORAL_TABLET | Freq: Four times a day (QID) | ORAL | Status: AC | PRN

## 2011-06-03 MED ORDER — HYDROCODONE-ACETAMINOPHEN 5-500 MG PO TABS: 1.0000 | ORAL_TABLET | Freq: Four times a day (QID) | ORAL | Status: AC | PRN

## 2011-06-03 MED FILL — Mycophenolate Mofetil Cap 250 MG: ORAL | Qty: 6 | Status: AC

## 2011-06-03 NOTE — Discharge Instructions (Signed)
KEEP BANDAGE CLEAN AND DRY CALL OFFICE FOR F/U APPT [phone] IN TWELVE DAYS KEEP HAND ELEVATED ABOVE HEART OK TO APPLY ICE TO OPERATIVE AREA CONTACT OFFICE IF ANY WORSENING PAIN OR CONCERNS.

## 2011-06-03 NOTE — Progress Notes (Signed)
D/C instructions reviewed with patient and son. RX x 6 given. No hh services or equipment needed. All questions answered. Pt d/c'ed via ambulation in stable condition

## 2011-06-03 NOTE — Discharge Summary (Signed)
Physician Discharge Summary  Patient ID: Krista Ellison MRN: [id number] DOB/AGE: [birth year] 56 y.o.  Admit date: 06/01/2011 Discharge date: 06/03/2011  Admission Diagnoses:LEFT DISTAL RADIUS FRACTURE  Discharge Diagnoses:  LEFT DISTAL RADIUS FRACTURE  Discharged Condition: good  Hospital Course: GOOD  Consults: None  Significant Diagnostic Studies:NONE ORIF DISTAL RADIUS 06/01/2011  Treatments: analgesia: Dilaudid  Discharge Exam: Blood pressure 109/68, pulse 86, temperature 98.6 F (37 C), temperature source Oral, resp. rate 18, last menstrual period 03/02/2010, SpO2 95.00%. PT SEEN EXAMINED ON DAY OF DISCHARGE DOING WELL  Disposition: Final discharge disposition not confirmed   Medication List  As of 06/03/2011 10:53 AM   TAKE these medications         docusate sodium 100 MG capsule   Commonly known as: COLACE   Take 1 capsule (100 mg total) by mouth 2 (two) times daily.      methocarbamol 500 MG tablet   Commonly known as: ROBAXIN   Take 1 tablet (500 mg total) by mouth 4 (four) times daily.      oxyCODONE-acetaminophen 5-325 MG per tablet   Commonly known as: PERCOCET   Take 1 tablet by mouth every 4 (four) hours as needed for pain.      vitamin C 500 MG tablet   Commonly known as: ASCORBIC ACID   Take 1 tablet (500 mg total) by mouth 2 (two) times daily.         ASK your doctor about these medications         acyclovir 400 MG tablet   Commonly known as: ZOVIRAX   Take 400 mg by mouth daily.      cholecalciferol 1000 UNITS tablet   Commonly known as: VITAMIN D   Take 2,000 Units by mouth daily.      cycloSPORINE modified 50 MG capsule   Commonly known as: GENGRAF   Take 150 mg by mouth 2 (two) times daily.      fish oil-omega-3 fatty acids 1000 MG capsule   Take 2 g by mouth daily.      lisinopril 10 MG tablet   Commonly known as: PRINIVIL,ZESTRIL   Take 10 mg by mouth daily.      mycophenolate 500 MG tablet   Commonly known as: CELLCEPT   Take 1,500 mg by mouth 2 (two) times daily.      pravastatin 10 MG tablet   Commonly known as: PRAVACHOL   Take 10 mg by mouth daily.      predniSONE 5 MG tablet   Commonly known as: DELTASONE   Take 5 mg by mouth daily.      sulfamethoxazole-trimethoprim 400-80 MG per tablet   Commonly known as: BACTRIM,SEPTRA   Take 1 tablet by mouth every Monday, Wednesday, and Friday.      vitamin C 500 MG tablet   Commonly known as: ASCORBIC ACID   Take 500 mg by mouth daily.      vitamin E 400 UNIT capsule   Take 400 Units by mouth daily.           Follow-up Information    Follow up with Sharma Covert, MD in 12 days.   Contact information:   [facility] [address] [place] [address] [phone]          Signed: Sharma Covert 06/03/2011, 10:53 AM

## 2011-06-14 DIAGNOSIS — D849 Immunodeficiency, unspecified: Secondary | ICD-10-CM | POA: Insufficient documentation

## 2011-06-21 ENCOUNTER — Emergency Department (INDEPENDENT_AMBULATORY_CARE_PROVIDER_SITE_OTHER)
Admission: EM | Admit: 2011-06-21 | Discharge: 2011-06-21 | Disposition: A | Discharge status: Home / Self Care | Payer: Medicare Other | Source: Home / Self Care | Attending: Emergency Medicine | Admitting: Emergency Medicine

## 2011-06-21 ENCOUNTER — Emergency Department (HOSPITAL_COMMUNITY)
Admission: EM | Admit: 2011-06-21 | Discharge: 2011-06-21 | Disposition: A | Discharge status: Home / Self Care | Payer: Medicare Other | Attending: Emergency Medicine | Admitting: Emergency Medicine

## 2011-06-21 ENCOUNTER — Encounter (HOSPITAL_COMMUNITY): Payer: Self-pay | Admitting: *Deleted

## 2011-06-21 DIAGNOSIS — M79606 Pain in leg, unspecified: Secondary | ICD-10-CM

## 2011-06-21 DIAGNOSIS — I10 Essential (primary) hypertension: Secondary | ICD-10-CM | POA: Insufficient documentation

## 2011-06-21 DIAGNOSIS — R7989 Other specified abnormal findings of blood chemistry: Secondary | ICD-10-CM

## 2011-06-21 DIAGNOSIS — R791 Abnormal coagulation profile: Secondary | ICD-10-CM

## 2011-06-21 DIAGNOSIS — J449 Chronic obstructive pulmonary disease, unspecified: Secondary | ICD-10-CM | POA: Insufficient documentation

## 2011-06-21 DIAGNOSIS — M79609 Pain in unspecified limb: Secondary | ICD-10-CM

## 2011-06-21 DIAGNOSIS — J4489 Other specified chronic obstructive pulmonary disease: Secondary | ICD-10-CM | POA: Insufficient documentation

## 2011-06-21 DIAGNOSIS — M7989 Other specified soft tissue disorders: Secondary | ICD-10-CM

## 2011-06-21 DIAGNOSIS — I809 Phlebitis and thrombophlebitis of unspecified site: Secondary | ICD-10-CM

## 2011-06-21 HISTORY — DX: Unspecified fracture of left forearm, initial encounter for closed fracture: S52.92XA

## 2011-06-21 LAB — CBC
HCT: 40.1 % (ref 36.0–46.0)
MCH: 29.9 pg (ref 26.0–34.0)
MCV: 90.1 fL (ref 78.0–100.0)
Platelets: 231 10*3/uL (ref 150–400)
Platelets: 186 10*3/uL (ref 150–400)
RBC: 3.8 MIL/uL — ABNORMAL LOW (ref 3.87–5.11)
RDW: 13.2 % (ref 11.5–15.5)
WBC: 7.9 10*3/uL (ref 4.0–10.5)
WBC: 5.9 10*3/uL (ref 4.0–10.5)

## 2011-06-21 LAB — BASIC METABOLIC PANEL
CO2: 22 mEq/L (ref 19–32)
Glucose, Bld: 99 mg/dL (ref 70–99)
Potassium: 4.2 mEq/L (ref 3.5–5.1)
Sodium: 137 mEq/L (ref 135–145)

## 2011-06-21 LAB — DIFFERENTIAL
Lymphocytes Relative: 32 % (ref 12–46)
Lymphs Abs: 1.9 10*3/uL (ref 0.7–4.0)
Neutro Abs: 3.5 10*3/uL (ref 1.7–7.7)
Neutrophils Relative %: 60 % (ref 43–77)

## 2011-06-21 LAB — POCT I-STAT, CHEM 8
BUN: 34 mg/dL — ABNORMAL HIGH (ref 6–23)
Calcium, Ion: 1.18 mmol/L (ref 1.12–1.32)
Chloride: 108 mEq/L (ref 96–112)
Glucose, Bld: 103 mg/dL — ABNORMAL HIGH (ref 70–99)
HCT: 43 % (ref 36.0–46.0)
TCO2: 23 mmol/L (ref 0–100)

## 2011-06-21 LAB — PROTIME-INR: INR: 0.97 (ref 0.00–1.49)

## 2011-06-21 LAB — APTT: aPTT: 29 seconds (ref 24–37)

## 2011-06-21 MED ORDER — RIVAROXABAN 15 MG PO TABS: 15.0000 mg | ORAL_TABLET | Freq: Two times a day (BID) | ORAL | Status: DC

## 2011-06-21 MED ORDER — CEPHALEXIN 500 MG PO CAPS: 500.0000 mg | ORAL_CAPSULE | Freq: Four times a day (QID) | ORAL | Status: AC

## 2011-06-21 MED ORDER — SODIUM CHLORIDE 0.9 % IV SOLN
Freq: Once | INTRAVENOUS | Status: AC
  Administered 2011-06-21: 17:00:00 via INTRAVENOUS

## 2011-06-21 MED ORDER — RIVAROXABAN 15 MG PO TABS
15.0000 mg | ORAL_TABLET | ORAL | Status: AC
Start: 2011-06-21 — End: 2011-06-21
  Administered 2011-06-21: 15 mg via ORAL
  Filled 2011-06-21: qty 1

## 2011-06-21 MED ORDER — OXYCODONE-ACETAMINOPHEN 5-325 MG PO TABS: 2.0000 | ORAL_TABLET | ORAL | Status: AC | PRN

## 2011-06-21 NOTE — ED Notes (Signed)
Pt with c/o varicose vein pain posterior left knee - has appt with vein specialist April 10th - pt works on feet 8 hours a day

## 2011-06-21 NOTE — ED Notes (Signed)
Reported elevated potassium

## 2011-06-21 NOTE — H&P (Signed)
FMTS Attending Note  Patient case discussed with resident Dr. Cristal Ford, and I agree with her assessment and plan as described.  Patient with superficial VTE and without signs or symptoms of PE.  To start on Xarelto from ED and follow up in outpatient office.   Paula Compton, M.D.

## 2011-06-21 NOTE — Discharge Instructions (Signed)
Call the family practice clinic tomorrow to schedule an appointment to be seen. If you have any trouble getting xarelto, call clinic as we may have samples to give if necessary.  Phlebitis Phlebitis is a redness, tenderness and soreness (inflammation) in a vein. This can occur in your arms, legs, or torso (trunk), as well as deeper inside your body.  CAUSES  Phlebitis can be triggered by multiple factors. These include:  Reduced (restricted) blood flow through your veins. This happens with prolonged bed rest, long distance travel, injury or surgery. Being overweight (obese) and pregnant can also restrict blood flow and lead to phlebitis.   Putting a catheter in the vein (intravenous or IV) and giving certain medications through in the vein (intravenously).   Cancer and cancer treatment.   Use of illegal intravenous drugs.   Inflammatory diseases.   Inherited (genetic) diseases that increase the risk for blood clots.   Hormone therapy (such as birth control pills).  SYMPTOMS   Red, tender, swollen, painful area on your skin.   Usually, the area will be long and narrow.   Low grade fever.   Significant firmness along the center of this area. This can indicate that a blood clot has formed.   Surrounding redness or a high fever, which can indicate an infection (cellulitis).  DIAGNOSIS   The appearance of your condition and your symptoms will cause your caregiver to suspect phlebitis. Usually, this is enough for a diagnosis.   Your caregiver may request blood tests or an ultrasound test of the area to be sure you do not have an infection or a blood clot. Blood tests and discussing your family history may also indicate if you have an underlying genetic disease that causes blood clots.   Occasionally, a piece of tissue is taken from the body (biopsy) if an unusual cause of phlebitis is suspected.  TREATMENT   Raise (elevate) the affected area above the level of the heart.   Apply  a warm compress or heating pad for 20 minutes, 3 or 4 times a day. If you use an electric heating pad, follow the directions so you do not burn yourself.   Anti-inflammatory medications are usually recommended. Follow your caregiver's directions.   Any IV catheter, if present, will be removed by your caregiver.   Your caregiver may prescribe medicines that kill germs (antibiotics) if an infection is present.   Your caregiver may recommend blood thinners if a blood clot is suspected or present.   Support stockings or bandages may be helpful, depending on the cause and location of the phlebitis.   Surgery may be needed to remove very damaged sections of vein, but this is rare.  HOME CARE INSTRUCTIONS   Take medications exactly as prescribed.   Follow up with your caregiver as directed.   Use support stockings or bandages if advised. These will speed healing and prevent recurrence.   If you are on blood thinners:   Do follow-up blood tests exactly as directed.   Check with your caregiver before using any new medications.   Wear a pendant to show that you are on blood thinners.   For phlebitis in the legs:   Avoid prolonged standing or bed rest.   Keep your legs moving. Raise your legs with sitting or lying.   Do not smoke.   Women, particularly those over the age of 62, should consider the risks and benefits of taking the contraceptive pill. This kind of hormone treatment can increase  your risk for blood clots.  SEEK MEDICAL CARE IF:   You have unusual bruising or any bleeding problems.   Swelling or pain in your affected arm or leg is not gradually improving.   You are on anti-inflammatory medication and you develop belly (abdominal) pain.  SEEK IMMEDIATE MEDICAL CARE IF:   An unexplained oral temperature above 100.5 F (38.1 C) develops.   You have sudden onset of chest pain or difficulty breathing.  Document Released: 03/07/2001 Document Revised: 03/02/2011 Document  Reviewed: 12/07/2008 [facility] Patient Information 2012 [place], [place].

## 2011-06-21 NOTE — ED Notes (Signed)
Pt here to have further eval of left above the knee vein.  Pt hd positive d-dimer and sent here to r/o clot.  Surgery on March 7th on left forearm

## 2011-06-21 NOTE — ED Notes (Signed)
C/o pain behind left knee rad. slightly proximal and distal to painful area; onset x5 days ago; streaked ecchymosis noted to same area with golf ball sized raised area present - painful to touch; recent sx to LUE (fractured forearm); states [facility] at [facility] today - had lab work done which revealed positive d-dimer - pt subsequently sent to ED for further eval of same; denies SOB, CP, tachypnea nor other symptoms at this time

## 2011-06-21 NOTE — ED Provider Notes (Addendum)
History     CSN: [id number]  Arrival date & time 06/21/11  1512   First MD Initiated Contact with Patient 06/21/11 1546      Chief Complaint  Patient presents with  . Leg Pain    left leg knot area above knee and sent here to r/o clot from [facility]--Positive d-dimer    (Consider location/radiation/quality/duration/timing/severity/associated sxs/prior treatment) Patient is a 56 y.o. female presenting with leg pain. The history is provided by the patient.  Leg Pain    patient here to be evaluated in urgent care for pain to her left distal thigh. Concern was that she may have a DVT. She had a positive d-dimer and was sent here for a duplex ultrasound. Patient does have risk factors for DVT including recent surgery. Denies any symptoms of pulmonary embolism such as pleuritic chest pain or shortness of breath. Pain is worse with palpation and walking and better with nothing no prior history of DVT  Past Medical History  Diagnosis Date  . Hypertension     Takes Lisinopril  . Hyperlipidemia     takes Pravastatin  . COPD (chronic obstructive pulmonary disease)     hx of. Had lung transplant  . Bronchitis     hx of  . Pneumonia     hx of  . Dizziness   . Arthritis   . Left radial fracture     Past Surgical History  Procedure Date  . Lung transplant, double 2002  . Dilation and curettage of uterus   . Cardiac catheterization 2000    Clean cath  . Wrist surgery     Family History  Problem Relation Age of Onset  . Anesthesia problems Neg Hx   . Hypotension Neg Hx   . Pseudochol deficiency Neg Hx   . Malignant hyperthermia Neg Hx     History  Substance Use Topics  . Smoking status: Former Smoker -- 1.5 packs/day for 17 years  . Smokeless tobacco: Not on file  . Alcohol Use: Yes    OB History    Grav Para Term Preterm Abortions TAB SAB Ect Mult Living                  Review of Systems  All other systems reviewed and are negative.    Allergies  Review of  patient's allergies indicates no known allergies.  Home Medications   Current Outpatient Rx  Name Route Sig Dispense Refill  . ACYCLOVIR 400 MG PO TABS Oral Take 400 mg by mouth daily.    Marland Kitchen VITAMIN D 1000 UNITS PO TABS Oral Take 2,000 Units by mouth daily.    . CYCLOSPORINE MODIFIED 50 MG PO CAPS (GENGRAF ONLY) Oral Take 150 mg by mouth 2 (two) times daily.    . OMEGA-3 FATTY ACIDS 1000 MG PO CAPS Oral Take 2 g by mouth daily.    Marland Kitchen LISINOPRIL 10 MG PO TABS Oral Take 10 mg by mouth daily.    Marland Kitchen MYCOPHENOLATE MOFETIL 500 MG PO TABS Oral Take 1,500 mg by mouth 2 (two) times daily.    Marland Kitchen PRAVASTATIN SODIUM 10 MG PO TABS Oral Take 10 mg by mouth daily.    Marland Kitchen PREDNISONE 5 MG PO TABS Oral Take 5 mg by mouth daily.    . SULFAMETHOXAZOLE-TRIMETHOPRIM 400-80 MG PO TABS Oral Take 1 tablet by mouth every Monday, Wednesday, and Friday.    Marland Kitchen VITAMIN C 500 MG PO TABS Oral Take 500 mg by mouth daily.    Marland Kitchen  VITAMIN E 400 UNITS PO CAPS Oral Take 400 Units by mouth daily.      BP 115/74  Pulse 85  Temp(Src) 98 F (36.7 C) (Oral)  Resp 18  SpO2 98%  LMP 03/02/2010  Physical Exam  Nursing note and vitals reviewed. Constitutional: She is oriented to person, place, and time. Vital signs are normal. She appears well-developed and well-nourished.  Non-toxic appearance. No distress.  HENT:  Head: Normocephalic and atraumatic.  Eyes: Conjunctivae, EOM and lids are normal. Pupils are equal, round, and reactive to light.  Neck: Normal range of motion. Neck supple. No tracheal deviation present. No mass present.  Cardiovascular: Normal rate, regular rhythm and normal heart sounds.  Exam reveals no gallop.   No murmur heard. Pulmonary/Chest: Effort normal and breath sounds normal. No stridor. No respiratory distress. She has no decreased breath sounds. She has no wheezes. She has no rhonchi. She has no rales.  Abdominal: Soft. Normal appearance and bowel sounds are normal. She exhibits no distension. There is no  tenderness. There is no rebound and no CVA tenderness.  Musculoskeletal: Normal range of motion. She exhibits no edema and no tenderness.       Left distal thigh posteriorly with probable cord the superficial. No thigh swelling. No erythema of the skin.  Neurological: She is alert and oriented to person, place, and time. She has normal strength. No cranial nerve deficit or sensory deficit. GCS eye subscore is 4. GCS verbal subscore is 5. GCS motor subscore is 6.  Skin: Skin is warm and dry. No abrasion and no rash noted.  Psychiatric: She has a normal mood and affect. Her speech is normal and behavior is normal.    ED Course  Procedures (including critical care time)  Labs Reviewed - No data to display No results found.   No diagnosis found.    MDM  Patient's ultrasound was positive for a superficial thrombophlebitis. The patients lesion measures approximately 5 cm. Patient is also status post lung transplant. Patient will need to have inpatient hospitalization for anticoagulation        Toy Baker, MD 06/21/11 1654  Toy Baker, MD 06/21/11 1702  5:51 PM Pt seen by [facility] and pt will be started on xarlato and will f/u in there clinic  Toy Baker, MD 06/21/11 1751

## 2011-06-21 NOTE — H&P (Signed)
Hospital Consult Note:  Krista Ellison is an 56 y.o. female.    PCP: [facility]  Chief Complaint: Thigh pain, superficial thrombophlebitis.  HPI: 56 yo female with history of double lung transplant 2002 and recent distal radius fracture repair 06/01/11 presents with 5-6 days of posterior left thigh pain. She first noticed after shaving her legs, was tender behind the knee. Over next few days, this tenderness climbed up her posterior thigh few inches, saw bruising and hardness in vein. She has recently moved from [place] in September and has not established with PCP yet. SHe initially presented to Urgent Care and found to have elevated D-dimer, sent to [facility] and her doppler study revealed absence of DVT but presence of a superficial thigh venous thrombosis.   Review of Systems: She denies any leg trauma, fevers, LE edema, dyspnea, chest pain, palpitations, cough, hemoptysis. No personal or family history of blood clotting. She exercises by walking on a daily basis and denies recent sedentary changes.   Past Medical History  Diagnosis Date  . Hypertension     Takes Lisinopril  . Hyperlipidemia     takes Pravastatin  . COPD (chronic obstructive pulmonary disease)     hx of. Had lung transplant  . Bronchitis     hx of  . Pneumonia     hx of  . Dizziness   . Arthritis   . Left radial fracture     Past Surgical History  Procedure Date  . Lung transplant, double 2002  . Dilation and curettage of uterus   . Cardiac catheterization 2000    Clean cath  . Wrist surgery 05/2011    radial fx    Family History  Problem Relation Age of Onset  . Anesthesia problems Neg Hx   . Hypotension Neg Hx   . Pseudochol deficiency Neg Hx   . Malignant hyperthermia Neg Hx   . Diabetes Father   . Hypertension Father   . Cancer Mother     liver   Social History:  reports that she has quit smoking. She does not have any smokeless tobacco history on file. She reports  that she drinks about 3.5 ounces of alcohol per week. She reports that she does not use illicit drugs.  Allergies: No Known Allergies  Medications Prior to Admission  Medication Dose Route Frequency Provider Last Rate Last Dose  . 0.9 %  sodium chloride infusion   Intravenous Once Toy Baker, MD 125 mL/hr at 06/21/11 1701     Medications Prior to Admission  Medication Sig Dispense Refill  . acyclovir (ZOVIRAX) 400 MG tablet Take 400 mg by mouth daily.      . cholecalciferol (VITAMIN D) 1000 UNITS tablet Take 2,000 Units by mouth daily.      . cycloSPORINE modified (GENGRAF) 50 MG capsule Take 150 mg by mouth 2 (two) times daily.      . fish oil-omega-3 fatty acids 1000 MG capsule Take 2 g by mouth daily.      Marland Kitchen lisinopril (PRINIVIL,ZESTRIL) 10 MG tablet Take 10 mg by mouth daily.      . mycophenolate (CELLCEPT) 500 MG tablet Take 1,500 mg by mouth 2 (two) times daily.      . pravastatin (PRAVACHOL) 10 MG tablet Take 10 mg by mouth daily.      . predniSONE (DELTASONE) 5 MG tablet Take 5 mg by mouth daily.      Marland Kitchen sulfamethoxazole-trimethoprim (BACTRIM,SEPTRA) 400-80 MG per tablet Take 1 tablet  by mouth every Monday, Wednesday, and Friday.      . vitamin C (ASCORBIC ACID) 500 MG tablet Take 500 mg by mouth daily.      . vitamin E 400 UNIT capsule Take 400 Units by mouth daily.        Results for orders placed during the hospital encounter of 06/21/11 (from the past 48 hour(s))  D-DIMER, QUANTITATIVE     Status: Abnormal   Collection Time   06/21/11  1:34 PM      Component Value Range Comment   D-Dimer, Quant 0.75 (*) 0.00 - 0.48 (ug/mL-FEU)   CBC     Status: Normal   Collection Time   06/21/11  1:34 PM      Component Value Range Comment   WBC 7.9  4.0 - 10.5 (K/uL)    RBC 4.45  3.87 - 5.11 (MIL/uL)    Hemoglobin 13.3  12.0 - 15.0 (g/dL)    HCT 16.1  09.6 - 04.5 (%)    MCV 90.1  78.0 - 100.0 (fL)    MCH 29.9  26.0 - 34.0 (pg)    MCHC 33.2  30.0 - 36.0 (g/dL)    RDW 40.9  81.1 -  91.4 (%)    Platelets 231  150 - 400 (K/uL)   POCT I-STAT, CHEM 8     Status: Abnormal   Collection Time   06/21/11  1:47 PM      Component Value Range Comment   Sodium 138  135 - 145 (mEq/L)    Potassium 5.4 (*) 3.5 - 5.1 (mEq/L)    Chloride 108  96 - 112 (mEq/L)    BUN 34 (*) 6 - 23 (mg/dL)    Creatinine, Ser 7.82  0.50 - 1.10 (mg/dL)    Glucose, Bld 956 (*) 70 - 99 (mg/dL)    Calcium, Ion 2.13  1.12 - 1.32 (mmol/L)    TCO2 23  0 - 100 (mmol/L)    Hemoglobin 14.6  12.0 - 15.0 (g/dL)    HCT 08.6  57.8 - 46.9 (%)    No results found.  ROS  Blood pressure 115/74, pulse 85, temperature 98 F (36.7 C), temperature source Oral, resp. rate 18, last menstrual period 03/02/2010, SpO2 98.00%. Physical Exam  Vitals reviewed. Constitutional: She is oriented to person, place, and time. She appears well-developed and well-nourished. No distress.  HENT:  Head: Normocephalic and atraumatic.  Mouth/Throat: Oropharynx is clear and moist. No oropharyngeal exudate.  Eyes: EOM are normal. Pupils are equal, round, and reactive to light.  Neck: Neck supple.  Cardiovascular: Normal rate, regular rhythm, normal heart sounds and intact distal pulses.   No murmur heard. Respiratory: Effort normal and breath sounds normal. No respiratory distress. She has no wheezes. She has no rales.       Diminished BS in bases bilaterally  GI: Soft. Bowel sounds are normal. She exhibits no distension. There is no tenderness. There is no rebound.  Musculoskeletal: She exhibits edema and tenderness.       Slight bilateral nonpitting pedal/ankle edema.  Left posterior thigh with 4-5cm area tender, ecchymotic venous cord extending from just above popliteal fossa superiorly. Scattered diffuse small varicosities on bilateral lower legs.   Neurological: She is alert and oriented to person, place, and time. No cranial nerve deficit. She exhibits normal muscle tone. Coordination normal.  Skin: No rash noted.  Psychiatric:  She has a normal mood and affect.     Assessment/Plan 56 year old female with history of lung  transplant and recent radial fracture repair presents with 5 days left thigh pain and superficial venous thrombophlebitis.  1. Superficial venous thrombophlebitis. Doppler study does not show extension to the deep venous system. However this is approximately 5 cm and felt to be higher risk of extension, very small risk of PE. No personal history of VTE, her sole risk factor is recent wrist surgery. Discussed case with pharmacist who does not detect any potential drug interactions, therefore will start anticoagulation with xarelto, first dose given in ED. The patient will continue 15 mg twice a day for 21 days then decrease to once daily dosing 20 mg to complete 3 months. Since her risk factor is temporary, will continue with plans for short term anticoagulation. Patient will followup at family practice Center if unable to obtain medication may need to start lovenox temporarily. Will cover empirically for infection with Keflex x7 days. Percocet prn for pain control. Have discussed red flag symptoms or emergency care including shortness of breath, chest pain, tachycardia, palpitations, worsening of edema, fever. Since patient is new to the area, she will plan to initiate care at [facility] and schedule hospital followup within the next one week. Patient feels comfortable with discharge planning and verbalizes understanding of followup plans.  Above plan was discussed with Dr. Paula Compton and ED attending Dr. Freida Busman who are in agreement for discharge with close follow up.   Kitiara Hintze PGY-2 06/21/2011, 5:10 PM [phone]

## 2011-06-21 NOTE — Progress Notes (Signed)
Left lower extremity venous duplex completed.  Preliminary report is negative for DVT in the left leg and right common femoral vein.  There appears to be SVT in the left distal posterior thigh.

## 2011-06-21 NOTE — ED Provider Notes (Signed)
History     CSN: [id number]  Arrival date & time 06/21/11  1116   First MD Initiated Contact with Patient 06/21/11 1248      Chief Complaint  Patient presents with  . Leg Pain    (Consider location/radiation/quality/duration/timing/severity/associated sxs/prior treatment) HPI Comments: Patient reports atraumatic posterior left knee pain, swelling that has now migrated up the posterior aspect of her left thigh starting approximately 5 days ago. Pain is worse with palpation. No alleviating factors. Patient has tried walking around, without relief. Patient is 20 days status post left wrist ORIF. No history of cancer, hemoptysis, history of DVT/PE, exogenous estrogens, prolonged immobilization, prolonged trip. No coughing, wheezing, chest pain, shortness of breath. No calf swelling, leg erythema.   ROS as noted in HPI. All other ROS negative.   Patient is a 56 y.o. female presenting with leg pain. The history is provided by the patient. No language interpreter was used.  Leg Pain  The incident occurred more than 2 days ago. The incident occurred at home. The injury mechanism is unknown. The pain is present in the left leg. The pain has been constant since onset. Pertinent negatives include no numbness, no inability to bear weight, no loss of motion, no muscle weakness and no loss of sensation. She reports no foreign bodies present. The symptoms are aggravated by palpation. She has tried nothing for the symptoms. The treatment provided no relief.    Past Medical History  Diagnosis Date  . Hypertension     Takes Lisinopril  . Hyperlipidemia     takes Pravastatin  . COPD (chronic obstructive pulmonary disease)     hx of. Had lung transplant  . Bronchitis     hx of  . Pneumonia     hx of  . Dizziness   . Arthritis     Past Surgical History  Procedure Date  . Lung transplant, double 2002  . Dilation and curettage of uterus   . Cardiac catheterization 2000    Clean cath     Family History  Problem Relation Age of Onset  . Anesthesia problems Neg Hx   . Hypotension Neg Hx   . Pseudochol deficiency Neg Hx   . Malignant hyperthermia Neg Hx     History  Substance Use Topics  . Smoking status: Former Smoker -- 1.5 packs/day for 17 years  . Smokeless tobacco: Not on file  . Alcohol Use: Yes    OB History    Grav Para Term Preterm Abortions TAB SAB Ect Mult Living                  Review of Systems  Neurological: Negative for numbness.    Allergies  Review of patient's allergies indicates no known allergies.  Home Medications   Current Outpatient Rx  Name Route Sig Dispense Refill  . ACYCLOVIR 400 MG PO TABS Oral Take 400 mg by mouth daily.    Marland Kitchen VITAMIN D 1000 UNITS PO TABS Oral Take 2,000 Units by mouth daily.    . CYCLOSPORINE MODIFIED 50 MG PO CAPS (GENGRAF ONLY) Oral Take 150 mg by mouth 2 (two) times daily.    . OMEGA-3 FATTY ACIDS 1000 MG PO CAPS Oral Take 2 g by mouth daily.    Marland Kitchen LISINOPRIL 10 MG PO TABS Oral Take 10 mg by mouth daily.    Marland Kitchen MYCOPHENOLATE MOFETIL 500 MG PO TABS Oral Take 1,500 mg by mouth 2 (two) times daily.    Marland Kitchen PRAVASTATIN SODIUM 10  MG PO TABS Oral Take 10 mg by mouth daily.    Marland Kitchen PREDNISONE 5 MG PO TABS Oral Take 5 mg by mouth daily.    . SULFAMETHOXAZOLE-TRIMETHOPRIM 400-80 MG PO TABS Oral Take 1 tablet by mouth every Monday, Wednesday, and Friday.    Marland Kitchen VITAMIN C 500 MG PO TABS Oral Take 500 mg by mouth daily.    Marland Kitchen VITAMIN C 500 MG PO TABS Oral Take 1 tablet (500 mg total) by mouth 2 (two) times daily. 60 tablet 0  . VITAMIN E 400 UNITS PO CAPS Oral Take 400 Units by mouth daily.      BP 135/54  Pulse 92  Temp(Src) 98.8 F (37.1 C) (Oral)  Resp 20  SpO2 96%  LMP 03/02/2010  Physical Exam  Nursing note and vitals reviewed. Constitutional: She is oriented to person, place, and time. She appears well-developed and well-nourished. No distress.  HENT:  Head: Normocephalic and atraumatic.  Eyes: Conjunctivae  and EOM are normal.  Neck: Normal range of motion.  Cardiovascular: Normal rate, regular rhythm, normal heart sounds and intact distal pulses.   Pulmonary/Chest: Effort normal and breath sounds normal.  Abdominal: Soft. She exhibits no distension.  Musculoskeletal: Normal range of motion.       4-5 cm palpable, tender cord and discoloration posterior left leg several centimeters above the knee.  no tenderness along medial left thigh. Knee exam within normal limits. Calves symmetric, no pitting edema. PT 2+.  Neurological: She is alert and oriented to person, place, and time.  Skin: Skin is warm and dry.  Psychiatric: She has a normal mood and affect. Her behavior is normal. Judgment and thought content normal.    ED Course  Procedures (including critical care time)  Labs Reviewed  D-DIMER, QUANTITATIVE - Abnormal; Notable for the following:    D-Dimer, Quant 0.75 (*)    All other components within normal limits  POCT I-STAT, CHEM 8 - Abnormal; Notable for the following:    Potassium 5.4 (*)    BUN 34 (*)    Glucose, Bld 103 (*)    All other components within normal limits  CBC   No results found.   1. Leg pain, posterior   2. Positive D dimer    Results for orders placed during the hospital encounter of 06/21/11  D-DIMER, QUANTITATIVE      Component Value Range   D-Dimer, Quant 0.75 (*) 0.00 - 0.48 (ug/mL-FEU)  CBC      Component Value Range   WBC 7.9  4.0 - 10.5 (K/uL)   RBC 4.45  3.87 - 5.11 (MIL/uL)   Hemoglobin 13.3  12.0 - 15.0 (g/dL)   HCT 16.1  09.6 - 04.5 (%)   MCV 90.1  78.0 - 100.0 (fL)   MCH 29.9  26.0 - 34.0 (pg)   MCHC 33.2  30.0 - 36.0 (g/dL)   RDW 40.9  81.1 - 91.4 (%)   Platelets 231  150 - 400 (K/uL)  POCT I-STAT, CHEM 8      Component Value Range   Sodium 138  135 - 145 (mEq/L)   Potassium 5.4 (*) 3.5 - 5.1 (mEq/L)   Chloride 108  96 - 112 (mEq/L)   BUN 34 (*) 6 - 23 (mg/dL)   Creatinine, Ser 7.82  0.50 - 1.10 (mg/dL)   Glucose, Bld 956 (*) 70 -  99 (mg/dL)   Calcium, Ion 2.13  0.86 - 1.32 (mmol/L)   TCO2 23  0 - 100 (mmol/L)  Hemoglobin 14.6  12.0 - 15.0 (g/dL)   HCT 40.9  81.1 - 91.4 (%)      MDM  Previous records reviewed. Patient had surgery to repair a left distal radius fracture 20 days ago.  Patient has a positive d-dimer, and several risk factors concerning for DVT. Suspect superficial venous thrombosis, but need to rule out DVT. Also has mild hyperkalemia. Transferring to the ER. Discussed results, and plan with patient, who agrees with transfer.  Luiz Blare, MD 06/21/11 1455

## 2011-07-04 ENCOUNTER — Ambulatory Visit (INDEPENDENT_AMBULATORY_CARE_PROVIDER_SITE_OTHER): Payer: BC Managed Care – PPO | Admitting: Family Medicine

## 2011-07-04 ENCOUNTER — Encounter: Payer: Self-pay | Admitting: Family Medicine

## 2011-07-04 VITALS — BP 112/62 | HR 68 | Temp 97.9°F | Ht 67.0 in | Wt 167.0 lb

## 2011-07-04 DIAGNOSIS — Z942 Lung transplant status: Secondary | ICD-10-CM

## 2011-07-04 DIAGNOSIS — I809 Phlebitis and thrombophlebitis of unspecified site: Secondary | ICD-10-CM

## 2011-07-04 DIAGNOSIS — E785 Hyperlipidemia, unspecified: Secondary | ICD-10-CM

## 2011-07-04 DIAGNOSIS — Z23 Encounter for immunization: Secondary | ICD-10-CM

## 2011-07-04 DIAGNOSIS — Z789 Other specified health status: Secondary | ICD-10-CM

## 2011-07-04 DIAGNOSIS — I10 Essential (primary) hypertension: Secondary | ICD-10-CM | POA: Insufficient documentation

## 2011-07-04 DIAGNOSIS — E7800 Pure hypercholesterolemia, unspecified: Secondary | ICD-10-CM | POA: Insufficient documentation

## 2011-07-04 DIAGNOSIS — Z9189 Other specified personal risk factors, not elsewhere classified: Secondary | ICD-10-CM | POA: Insufficient documentation

## 2011-07-04 LAB — CBC
Hemoglobin: 12.5 g/dL (ref 12.0–15.0)
MCV: 92.5 fL (ref 78.0–100.0)
Platelets: 200 10*3/uL (ref 150–400)
RBC: 4.27 MIL/uL (ref 3.87–5.11)
WBC: 5.2 10*3/uL (ref 4.0–10.5)

## 2011-07-04 LAB — COMPREHENSIVE METABOLIC PANEL
Chloride: 106 mEq/L (ref 96–112)
Creat: 1.16 mg/dL — ABNORMAL HIGH (ref 0.50–1.10)
Potassium: 4.9 mEq/L (ref 3.5–5.3)
Total Bilirubin: 0.9 mg/dL (ref 0.3–1.2)
Total Protein: 6.6 g/dL (ref 6.0–8.3)

## 2011-07-04 LAB — TSH: TSH: 0.743 u[IU]/mL (ref 0.350–4.500)

## 2011-07-04 MED ORDER — RIVAROXABAN 20 MG PO TABS: 20.0000 mg | ORAL_TABLET | Freq: Every day | ORAL | Status: DC

## 2011-07-04 NOTE — Patient Instructions (Signed)
Nice to see you again Will continue xarelto for 3 months since your thrombophlebitis had a risk of spreading. Will call if labs are abnormal. OK to have tests faxed over to [facility]. Make appoitnment for check up in 6-12 months or sooner if needed.  We can arrange blood draws here if you prefer, to be faxed to [facility] if more convenient for you-we just need an order with specific tests from the doctor.

## 2011-07-05 ENCOUNTER — Encounter: Payer: Self-pay | Admitting: Family Medicine

## 2011-07-05 NOTE — Assessment & Plan Note (Addendum)
Check d-LDL as patient not fasting for baseline value. F/u in 6 months for fasting labs.

## 2011-07-05 NOTE — Assessment & Plan Note (Signed)
Will continue 3 months treatment anticoagulation with xarelto given the original size and location. Risk factor for VTE was post-surgical (within one month) for radial wrist fx repair which is healing well. Patient plans on seeing vein specialist for treatment of varicose veins in the future. Advised to f/u if symptoms worsen. Will check CBC and renal function since on anticoagulation.

## 2011-07-05 NOTE — Assessment & Plan Note (Signed)
At goal today, continues on lisinopril monotherapy. Check labs today.

## 2011-07-05 NOTE — Progress Notes (Signed)
  Subjective:    Patient ID: Krista Ellison, female    DOB: 1955/06/08, 56 y.o.   MRN: [id number]  HPI ED follow up, new patient  1. Superficial thrombophlebitis. Concern for potential extension given size and location, implication on lung transplant patient, therefore plan to treat with anticoagulation x 3 months with xarelto. Has taken starting dose as directed. Pain and cord have improved. Minimal hardness in vein now. No fevers, swelling, increased redness.   2. Hx Lung transplant. Continues on immunosupressants. Had surgery in 2002 at [facility] and followed closely there. She has now established at [facility] since moving to [place]. On cellcept, cyclosporine and prednisone 5mg  -though she sometimes splits prednisone in half. Denies any increase dyspnea, cough, swelling.  3. HM. Patient has mammogram and DEXA scan scheduled for next month. Had a radial fracture after fall, risk factor for osteoporosis is chronic prednisone and has known osteopenia at last check. Not currently on bisphosphonate due to side effects of vertigo/dizziness previously, but takes calcium and vit D daily. No bone pain, falls.   4. Hx skin cancer. Sees dermatology regularly as she had 2 skin cancers removed previously. Mother had skin cancer also.   Review of Systems See HPI otherwise negative. Former smoker.     Objective:   Physical Exam  Vitals reviewed. Constitutional: She is oriented to person, place, and time. She appears well-developed and well-nourished. No distress.  HENT:  Head: Normocephalic and atraumatic.  Mouth/Throat: Oropharynx is clear and moist.  Eyes: EOM are normal. Pupils are equal, round, and reactive to light.  Cardiovascular: Normal rate, regular rhythm, normal heart sounds and intact distal pulses.   No murmur heard. Pulmonary/Chest: Effort normal and breath sounds normal. No respiratory distress. She has no wheezes. She has no rales.       diminsished sounds in  bilateral bases.  Musculoskeletal: She exhibits edema. She exhibits no tenderness.       Trace bilateral pedal edema. Chronic vascular insufficiency changes-darkening of skin, small varicosities bilaterally. Place of SVT improved-minimal hardening of vein, area of involvement much decreased to 1-2 cm (was 5-6 cm previously), no redness or tenderness.  Neurological: She is alert and oriented to person, place, and time. Coordination normal.  Skin: She is not diaphoretic.  Psychiatric: She has a normal mood and affect.       Assessment & Plan:

## 2011-07-05 NOTE — Assessment & Plan Note (Signed)
Will have DEXA scan faxed once resulted. Patient has not tolerated bisphosphonates in the past, but will readdress options if necessary. Continues low dose prednisone 5 mg daily.

## 2011-08-10 DIAGNOSIS — Z85828 Personal history of other malignant neoplasm of skin: Secondary | ICD-10-CM | POA: Insufficient documentation

## 2011-08-24 ENCOUNTER — Ambulatory Visit (INDEPENDENT_AMBULATORY_CARE_PROVIDER_SITE_OTHER): Payer: BC Managed Care – PPO | Admitting: *Deleted

## 2011-08-24 DIAGNOSIS — Z4802 Encounter for removal of sutures: Secondary | ICD-10-CM

## 2011-08-24 NOTE — Progress Notes (Signed)
Patient in office for suture removal . Had a lesion removed on 08/14/2011 at [facility] from left  lower scapula area.  Area appears well healed. No signs of infection noted. She was told to have stitches removed locally in 10 days. Three sutures removed without difficulty. However there is slight separation of edges in center of  incision. No drainage. Dr. Deirdre Priest notified and he came in to look , suggested steri strips.  Four steri strips applied. Patient advised on wound care and to call if any signs of infection.

## 2012-01-21 ENCOUNTER — Encounter (HOSPITAL_COMMUNITY): Payer: Self-pay | Admitting: Emergency Medicine

## 2012-01-21 ENCOUNTER — Emergency Department (HOSPITAL_COMMUNITY): Payer: Medicare Other

## 2012-01-21 ENCOUNTER — Emergency Department (HOSPITAL_COMMUNITY)
Admission: EM | Admit: 2012-01-21 | Discharge: 2012-01-21 | Disposition: A | Discharge status: Home / Self Care | Payer: Medicare Other | Attending: Emergency Medicine | Admitting: Emergency Medicine

## 2012-01-21 DIAGNOSIS — Z85828 Personal history of other malignant neoplasm of skin: Secondary | ICD-10-CM | POA: Insufficient documentation

## 2012-01-21 DIAGNOSIS — Z79899 Other long term (current) drug therapy: Secondary | ICD-10-CM | POA: Insufficient documentation

## 2012-01-21 DIAGNOSIS — J449 Chronic obstructive pulmonary disease, unspecified: Secondary | ICD-10-CM | POA: Insufficient documentation

## 2012-01-21 DIAGNOSIS — Z87891 Personal history of nicotine dependence: Secondary | ICD-10-CM | POA: Insufficient documentation

## 2012-01-21 DIAGNOSIS — I10 Essential (primary) hypertension: Secondary | ICD-10-CM | POA: Insufficient documentation

## 2012-01-21 DIAGNOSIS — Z8781 Personal history of (healed) traumatic fracture: Secondary | ICD-10-CM | POA: Insufficient documentation

## 2012-01-21 DIAGNOSIS — J4489 Other specified chronic obstructive pulmonary disease: Secondary | ICD-10-CM | POA: Insufficient documentation

## 2012-01-21 DIAGNOSIS — Z942 Lung transplant status: Secondary | ICD-10-CM | POA: Insufficient documentation

## 2012-01-21 DIAGNOSIS — M549 Dorsalgia, unspecified: Secondary | ICD-10-CM | POA: Insufficient documentation

## 2012-01-21 DIAGNOSIS — M129 Arthropathy, unspecified: Secondary | ICD-10-CM | POA: Insufficient documentation

## 2012-01-21 DIAGNOSIS — E785 Hyperlipidemia, unspecified: Secondary | ICD-10-CM | POA: Insufficient documentation

## 2012-01-21 DIAGNOSIS — Z8701 Personal history of pneumonia (recurrent): Secondary | ICD-10-CM | POA: Insufficient documentation

## 2012-01-21 MED ORDER — OXYCODONE-ACETAMINOPHEN 5-325 MG PO TABS: 1.0000 | ORAL_TABLET | Freq: Four times a day (QID) | ORAL | Status: DC | PRN

## 2012-01-21 MED ORDER — CYCLOBENZAPRINE HCL 10 MG PO TABS: 10.0000 mg | ORAL_TABLET | Freq: Two times a day (BID) | ORAL | Status: DC | PRN

## 2012-01-21 NOTE — ED Notes (Addendum)
Pt stated she had gradual back yesterday that worsened, rates pain 3/10 at rest and 9/10 sharp pain with activity. Pain located at left side under shoulder blade. Pt took 5mg  oxycodone po x4 yesterday from left over meds and applied heating pad to back but pain persists.

## 2012-01-21 NOTE — ED Provider Notes (Signed)
History   This chart was scribed for Shelda Jakes, MD by Toya Smothers. The patient was seen in room TR05C/TR05C. Patient's care was started at 1337.  CSN: [id number]  Arrival date & time 01/21/12  1337   First MD Initiated Contact with Patient 01/21/12 1557      Chief Complaint  Patient presents with  . Back Pain   Patient is a 56 y.o. female presenting with back pain. The history is provided by the patient. No language interpreter was used.  Back Pain  This is a new problem. The current episode started more than 2 days ago. The problem occurs constantly. The problem has not changed since onset.The pain is associated with no known injury. The pain is present in the thoracic spine. The quality of the pain is described as shooting and aching. The pain does not radiate. The pain is at a severity of 9/10. The pain is severe. The symptoms are aggravated by certain positions. The pain is the same all the time. Pertinent negatives include no chest pain, no fever, no numbness, no weight loss, no abdominal swelling, no bowel incontinence, no bladder incontinence, no leg pain, no paresthesias and no paresis. Treatments tried: hydrocodone. The treatment provided no relief. Risk factors include menopause (occupational lifting).    Krista Ellison is a 56 y.o. female who presents to the Emergency Department complaining of 5 days of gradually worsening, constant, left upper back pain after lifting. Pain is described as a shooting stiffness, aggravated with movement, and alleviated with immobilization. She reports taking oxycodone with mild temporary relief for pain, but pain is persistent. No back pain, neck pain, SOB, cough, chest pain, congestion, or fever. Pt lists a h/o COPD, HTN, lung transplant (2002), Cardiac catheterization, Pneumonia, and Bronchitis. [facility] is following pulmonary progress and Pt lists Dr. Cristal Ford as PCP.   Past Medical History  Diagnosis Date  . Hypertension    Takes Lisinopril  . Hyperlipidemia     takes Pravastatin  . COPD (chronic obstructive pulmonary disease)     hx of. Had lung transplant  . Bronchitis     hx of  . Pneumonia     hx of  . Dizziness   . Arthritis   . Left radial fracture   . Alpha-1-antitrypsin deficiency     s/p double lung transplant  . Cancer     skin x 2    Past Surgical History  Procedure Date  . Lung transplant, double 2002  . Dilation and curettage of uterus   . Cardiac catheterization 2000    Clean cath  . Wrist surgery 05/2011    radial fx    Family History  Problem Relation Age of Onset  . Anesthesia problems Neg Hx   . Hypotension Neg Hx   . Pseudochol deficiency Neg Hx   . Malignant hyperthermia Neg Hx   . Diabetes Father   . Hypertension Father   . Asthma Father   . Heart disease Father   . Hyperlipidemia Father   . Thyroid disease Mother   . Cancer Mother     liver, hodgkins, skin  . Thyroid disease Sister     History  Substance Use Topics  . Smoking status: Former Smoker -- 1.5 packs/day for 17 years  . Smokeless tobacco: Not on file  . Alcohol Use: 3.5 oz/week    7 drink(s) per week   Review of Systems  Constitutional: Negative for fever and weight loss.  Respiratory: Negative for cough,  choking and shortness of breath.   Cardiovascular: Negative for chest pain, palpitations and leg swelling.  Gastrointestinal: Negative for bowel incontinence.  Genitourinary: Negative for bladder incontinence.  Musculoskeletal: Positive for back pain.  Neurological: Negative for numbness and paresthesias.  All other systems reviewed and are negative.    Allergies  Review of patient's allergies indicates no known allergies.  Home Medications   Current Outpatient Rx  Name Route Sig Dispense Refill  . ACYCLOVIR 400 MG PO TABS Oral Take 400 mg by mouth daily.    Marland Kitchen CALCIUM CITRATE 950 MG PO TABS Oral Take 1 tablet by mouth daily.    Marland Kitchen VITAMIN D 1000 UNITS PO TABS Oral Take 2,000 Units by  mouth daily.    . CYCLOSPORINE MODIFIED (NEORAL) 100 MG PO CAPS Oral Take 100 mg by mouth 2 (two) times daily. Take with #2  25 mg tablets for a 150 mg dose    . CYCLOSPORINE MODIFIED (NEORAL) 25 MG PO CAPS Oral Take 50 mg by mouth 2 (two) times daily. Take with 100 mg tablet for a 150 mg dose    . OMEGA-3 FATTY ACIDS 1000 MG PO CAPS Oral Take 2 g by mouth daily.    Marland Kitchen LISINOPRIL 10 MG PO TABS Oral Take 10 mg by mouth daily.    Marland Kitchen MAGNESIA PO Oral Take 400 mg by mouth daily.    Marland Kitchen PRAVASTATIN SODIUM 10 MG PO TABS Oral Take 10 mg by mouth daily.    Marland Kitchen PREDNISONE 5 MG PO TABS Oral Take 5 mg by mouth daily.    Marland Kitchen VITAMIN C 500 MG PO TABS Oral Take 500 mg by mouth daily.    Marland Kitchen VITAMIN E 400 UNITS PO CAPS Oral Take 400 Units by mouth daily.    . CYCLOBENZAPRINE HCL 10 MG PO TABS Oral Take 1 tablet (10 mg total) by mouth 2 (two) times daily as needed for muscle spasms. 20 tablet 0  . OXYCODONE-ACETAMINOPHEN 5-325 MG PO TABS Oral Take 1-2 tablets by mouth every 6 (six) hours as needed for pain. 20 tablet 0  . SULFAMETHOXAZOLE-TRIMETHOPRIM 400-80 MG PO TABS Oral Take 1 tablet by mouth every Monday, Wednesday, and Friday.      BP 146/88  Pulse 85  Temp 97.6 F (36.4 C) (Oral)  Resp 16  SpO2 96%  LMP 03/02/2010  Physical Exam  Nursing note and vitals reviewed. Constitutional: She is oriented to person, place, and time. She appears well-developed and well-nourished. No distress.  Neck: Normal range of motion. Neck supple. No tracheal deviation present.  Cardiovascular: Normal rate, regular rhythm and normal heart sounds.   No murmur heard. Pulmonary/Chest: Effort normal and breath sounds normal. No respiratory distress. She has no wheezes.  Abdominal: Soft. Bowel sounds are normal. There is no tenderness.  Musculoskeletal:       No midline tenderness.  Lymphadenopathy:    She has no cervical adenopathy.  Neurological: She is alert and oriented to person, place, and time. Coordination normal.    Skin: Skin is warm and dry. No rash noted. She is not diaphoretic.    ED Course  Procedures DIAGNOSTIC STUDIES: Oxygen Saturation is 96% on room air, normal by my interpretation.    COORDINATION OF CARE: 16:14- Evaluated Pt. Pt is awake, alert, and without distress. 16:20- Patient informed of clinical course, understand medical decision-making process, and agree with plan. 16:27- Ordered DG Chest 2 View 1 time imaging.   Labs Reviewed - No data to display Dg Chest  2 View  01/21/2012  *RADIOLOGY REPORT*  Clinical Data: Chest pain with shortness of breath for 5 days. History of hypertension and lung transplant.  CHEST - 2 VIEW  Comparison: 05/30/2011.  Findings: The heart size and mediastinal contours are stable. There are stable postsurgical changes within the sternum and mediastinum.  There are new surgical clips in the left axilla.  The lungs are clear.  There is no pleural effusion or pneumothorax.  No acute osseous findings are seen.  IMPRESSION: Postsurgical changes as described.  No acute cardiopulmonary process.   Original Report Authenticated By: Gerrianne Scale, M.D.      1. Back pain       MDM  Patient status post the lung transplant several years ago. Followed by [facility]. Onset of today's pain now seems to be more consistent with a rhomboid muscular pain chest x-ray is negative it is worse with movement of the left arm. We'll give the patient a work note also relaxers and pain medicine. Patient will followup with her primary care provider next few days and return for anything newer worse.     I personally performed the services described in this documentation, which was scribed in my presence. The recorded information has been reviewed and considered.      Shelda Jakes, MD 01/21/12 [phone]

## 2013-05-16 ENCOUNTER — Encounter (HOSPITAL_COMMUNITY): Payer: Self-pay | Admitting: Emergency Medicine

## 2013-05-16 ENCOUNTER — Emergency Department (HOSPITAL_COMMUNITY): Payer: Medicare Other

## 2013-05-16 ENCOUNTER — Emergency Department (HOSPITAL_COMMUNITY)
Admission: EM | Admit: 2013-05-16 | Discharge: 2013-05-16 | Disposition: A | Discharge status: Home / Self Care | Payer: Medicare Other | Attending: Emergency Medicine | Admitting: Emergency Medicine

## 2013-05-16 ENCOUNTER — Other Ambulatory Visit: Payer: Self-pay

## 2013-05-16 DIAGNOSIS — R059 Cough, unspecified: Secondary | ICD-10-CM

## 2013-05-16 DIAGNOSIS — Z79899 Other long term (current) drug therapy: Secondary | ICD-10-CM | POA: Insufficient documentation

## 2013-05-16 DIAGNOSIS — I10 Essential (primary) hypertension: Secondary | ICD-10-CM | POA: Insufficient documentation

## 2013-05-16 DIAGNOSIS — E8801 Alpha-1-antitrypsin deficiency: Secondary | ICD-10-CM | POA: Insufficient documentation

## 2013-05-16 DIAGNOSIS — R079 Chest pain, unspecified: Secondary | ICD-10-CM | POA: Insufficient documentation

## 2013-05-16 DIAGNOSIS — R05 Cough: Secondary | ICD-10-CM

## 2013-05-16 DIAGNOSIS — R0789 Other chest pain: Secondary | ICD-10-CM

## 2013-05-16 DIAGNOSIS — Z942 Lung transplant status: Secondary | ICD-10-CM | POA: Insufficient documentation

## 2013-05-16 DIAGNOSIS — M129 Arthropathy, unspecified: Secondary | ICD-10-CM | POA: Insufficient documentation

## 2013-05-16 DIAGNOSIS — Z85828 Personal history of other malignant neoplasm of skin: Secondary | ICD-10-CM | POA: Insufficient documentation

## 2013-05-16 DIAGNOSIS — E785 Hyperlipidemia, unspecified: Secondary | ICD-10-CM | POA: Insufficient documentation

## 2013-05-16 DIAGNOSIS — Z87891 Personal history of nicotine dependence: Secondary | ICD-10-CM | POA: Insufficient documentation

## 2013-05-16 LAB — BASIC METABOLIC PANEL
BUN: 31 mg/dL — AB (ref 6–23)
BUN: 28 mg/dL — AB (ref 6–23)
CALCIUM: 8.9 mg/dL (ref 8.4–10.5)
CO2: 24 mEq/L (ref 19–32)
CO2: 25 mEq/L (ref 19–32)
CREATININE: 1.03 mg/dL (ref 0.50–1.10)
Calcium: 9 mg/dL (ref 8.4–10.5)
Chloride: 101 mEq/L (ref 96–112)
Chloride: 101 mEq/L (ref 96–112)
Creatinine, Ser: 1.01 mg/dL (ref 0.50–1.10)
GFR, EST AFRICAN AMERICAN: 69 mL/min — AB (ref >90)
GFR, EST AFRICAN AMERICAN: 70 mL/min — AB (ref >90)
GFR, EST NON AFRICAN AMERICAN: 59 mL/min — AB (ref >90)
GFR, EST NON AFRICAN AMERICAN: 61 mL/min — AB (ref >90)
GLUCOSE: 102 mg/dL — AB (ref 70–99)
Glucose, Bld: 101 mg/dL — ABNORMAL HIGH (ref 70–99)
POTASSIUM: 7 meq/L — AB (ref 3.7–5.3)
Potassium: 4.7 mEq/L (ref 3.7–5.3)
Sodium: 136 mEq/L — ABNORMAL LOW (ref 137–147)
Sodium: 138 mEq/L (ref 137–147)

## 2013-05-16 LAB — PRO B NATRIURETIC PEPTIDE: Pro B Natriuretic peptide (BNP): 720.2 pg/mL — ABNORMAL HIGH (ref 0–125)

## 2013-05-16 LAB — CBC
HEMATOCRIT: 38.4 % (ref 36.0–46.0)
Hemoglobin: 12.6 g/dL (ref 12.0–15.0)
MCH: 29.9 pg (ref 26.0–34.0)
MCHC: 32.8 g/dL (ref 30.0–36.0)
MCV: 91.2 fL (ref 78.0–100.0)
Platelets: 195 10*3/uL (ref 150–400)
RBC: 4.21 MIL/uL (ref 3.87–5.11)
RDW: 13.7 % (ref 11.5–15.5)
WBC: 4.3 10*3/uL (ref 4.0–10.5)

## 2013-05-16 LAB — I-STAT TROPONIN, ED: Troponin i, poc: 0.01 ng/mL (ref 0.00–0.08)

## 2013-05-16 MED ORDER — HYDROCODONE-ACETAMINOPHEN 5-325 MG PO TABS: 2.0000 | ORAL_TABLET | ORAL | Status: DC | PRN

## 2013-05-16 MED ORDER — SODIUM CHLORIDE 0.9 % IV SOLN
Freq: Once | INTRAVENOUS | Status: AC
  Administered 2013-05-16: 15:00:00 via INTRAVENOUS

## 2013-05-16 MED ORDER — ACETAMINOPHEN 325 MG PO TABS
650.0000 mg | ORAL_TABLET | Freq: Once | ORAL | Status: AC
  Administered 2013-05-16: 650 mg via ORAL
  Filled 2013-05-16: qty 2

## 2013-05-16 MED ORDER — IOHEXOL 350 MG/ML SOLN
80.0000 mL | Freq: Once | INTRAVENOUS | Status: AC | PRN
  Administered 2013-05-16: 80 mL via INTRAVENOUS

## 2013-05-16 MED ORDER — OSELTAMIVIR PHOSPHATE 75 MG PO CAPS: 75.0000 mg | ORAL_CAPSULE | Freq: Two times a day (BID) | ORAL | Status: DC

## 2013-05-16 NOTE — ED Provider Notes (Signed)
Medical screening examination/treatment/procedure(s) were performed by non-physician practitioner and as supervising physician I was immediately available for consultation/collaboration.  EKG Interpretation    Date/Time:  Friday May 16 2013 09:54:17 EST Ventricular Rate:  81 PR Interval:  123 QRS Duration: 90 QT Interval:  350 QTC Calculation: 406 R Axis:   85 Text Interpretation:  Sinus rhythm Normal ECG Confirmed by Betsey Holiday  MD, Sruti Ayllon ([id number]) on 05/16/2013 9:58:39 AM              Orpah Greek, MD 05/16/13 [phone]

## 2013-05-16 NOTE — ED Notes (Signed)
Reported to Alyse Low, [place] Potassium is 7.0, but the specimen is grossly hemolyzed. Labs reordered

## 2013-05-16 NOTE — Discharge Instructions (Signed)
Chest Wall Pain Chest wall pain is pain in or around the bones and muscles of your chest. It may take up to 6 weeks to get better. It may take longer if you must stay physically active in your work and activities.  CAUSES  Chest wall pain may happen on its own. However, it may be caused by:  A viral illness like the flu.  Injury.  Coughing.  Exercise.  Arthritis.  Fibromyalgia.  Shingles. HOME CARE INSTRUCTIONS   Avoid overtiring physical activity. Try not to strain or perform activities that cause pain. This includes any activities using your chest or your abdominal and side muscles, especially if heavy weights are used.  Put ice on the sore area.  Put ice in a plastic bag.  Place a towel between your skin and the bag.  Leave the ice on for 15-20 minutes per hour while awake for the first 2 days.  Only take over-the-counter or prescription medicines for pain, discomfort, or fever as directed by your caregiver. SEEK IMMEDIATE MEDICAL CARE IF:   Your pain increases, or you are very uncomfortable.  You have a fever.  Your chest pain becomes worse.  You have new, unexplained symptoms.  You have nausea or vomiting.  You feel sweaty or lightheaded.  You have a cough with phlegm (sputum), or you cough up blood. MAKE SURE YOU:   Understand these instructions.  Will watch your condition.  Will get help right away if you are not doing well or get worse. Document Released: 03/13/2005 Document Revised: 06/05/2011 Document Reviewed: 11/07/2010 [facility] Patient Information 2014 [place], [place]. Upper Respiratory Infection, Adult An upper respiratory infection (URI) is also known as the common cold. It is often caused by a type of germ (virus). Colds are easily spread (contagious). You can pass it to others by kissing, coughing, sneezing, or drinking out of the same glass. Usually, you get better in 1 or 2 weeks.  HOME CARE   Only take medicine as told by your  doctor.  Use a warm mist humidifier or breathe in steam from a hot shower.  Drink enough water and fluids to keep your pee (urine) clear or pale yellow.  Get plenty of rest.  Return to work when your temperature is back to normal or as told by your doctor. You may use a face mask and wash your hands to stop your cold from spreading. GET HELP RIGHT AWAY IF:   After the first few days, you feel you are getting worse.  You have questions about your medicine.  You have chills, shortness of breath, or brown or red spit (mucus).  You have yellow or brown snot (nasal discharge) or pain in the face, especially when you bend forward.  You have a fever, puffy (swollen) neck, pain when you swallow, or white spots in the back of your throat.  You have a bad headache, ear pain, sinus pain, or chest pain.  You have a high-pitched whistling sound when you breathe in and out (wheezing).  You have a lasting cough or cough up blood.  You have sore muscles or a stiff neck. MAKE SURE YOU:   Understand these instructions.  Will watch your condition.  Will get help right away if you are not doing well or get worse. Document Released: 08/30/2007 Document Revised: 06/05/2011 Document Reviewed: 07/18/2010 [facility] Patient Information 2014 [place], [place].

## 2013-05-16 NOTE — ED Provider Notes (Signed)
CSN: [id number]     Arrival date & time 05/16/13  [id number] History   None    Chief Complaint  Patient presents with  . Chest Pain     (Consider location/radiation/quality/duration/timing/severity/associated sxs/prior Treatment) Patient is a 58 y.o. female presenting with cough. The history is provided by the patient. No language interpreter was used.  Cough Cough characteristics:  Non-productive Severity:  Moderate Onset quality:  Gradual Timing:  Constant Progression:  Worsening Chronicity:  New Smoker: no   Context: upper respiratory infection   Relieved by:  Nothing Worsened by:  Nothing tried Ineffective treatments:  None tried Associated symptoms: chest pain   Associated symptoms: no shortness of breath     Past Medical History  Diagnosis Date  . Hypertension     Takes Lisinopril  . Hyperlipidemia     takes Pravastatin  . COPD (chronic obstructive pulmonary disease)     hx of. Had lung transplant  . Bronchitis     hx of  . Pneumonia     hx of  . Dizziness   . Arthritis   . Left radial fracture   . Alpha-1-antitrypsin deficiency     s/p double lung transplant  . Cancer     skin x 2   Past Surgical History  Procedure Laterality Date  . Lung transplant, double  2002  . Dilation and curettage of uterus    . Cardiac catheterization  2000    Clean cath  . Wrist surgery  05/2011    radial fx   Family History  Problem Relation Age of Onset  . Anesthesia problems Neg Hx   . Hypotension Neg Hx   . Pseudochol deficiency Neg Hx   . Malignant hyperthermia Neg Hx   . Diabetes Father   . Hypertension Father   . Asthma Father   . Heart disease Father   . Hyperlipidemia Father   . Thyroid disease Mother   . Cancer Mother     liver, hodgkins, skin  . Thyroid disease Sister    History  Substance Use Topics  . Smoking status: Former Smoker -- 1.50 packs/day for 17 years  . Smokeless tobacco: Not on file  . Alcohol Use: 3.5 oz/week    7 drink(s) per week   OB  History   Grav Para Term Preterm Abortions TAB SAB Ect Mult Living                 Review of Systems  Respiratory: Positive for cough. Negative for shortness of breath.   Cardiovascular: Positive for chest pain.  All other systems reviewed and are negative.      Allergies  Review of patient's allergies indicates no known allergies.  Home Medications   Current Outpatient Rx  Name  Route  Sig  Dispense  Refill  . acyclovir (ZOVIRAX) 400 MG tablet   Oral   Take 400 mg by mouth daily.         . calcium citrate (CALCITRATE - DOSED IN MG ELEMENTAL CALCIUM) 950 MG tablet   Oral   Take 1 tablet by mouth daily.         . cholecalciferol (VITAMIN D) 1000 UNITS tablet   Oral   Take 2,000 Units by mouth daily.         . cyclobenzaprine (FLEXERIL) 10 MG tablet   Oral   Take 1 tablet (10 mg total) by mouth 2 (two) times daily as needed for muscle spasms.   20 tablet   0   .  cycloSPORINE modified (GENGRAF) 100 MG capsule   Oral   Take 100 mg by mouth 2 (two) times daily. Take with #2  25 mg tablets for a 150 mg dose         . cycloSPORINE modified (GENGRAF) 25 MG capsule   Oral   Take 50 mg by mouth 2 (two) times daily. Take with 100 mg tablet for a 150 mg dose         . fish oil-omega-3 fatty acids 1000 MG capsule   Oral   Take 2 g by mouth daily.         Marland Kitchen lisinopril (PRINIVIL,ZESTRIL) 10 MG tablet   Oral   Take 10 mg by mouth daily.         . Magnesium Hydroxide (MAGNESIA PO)   Oral   Take 400 mg by mouth daily.         Marland Kitchen oxyCODONE-acetaminophen (PERCOCET/ROXICET) 5-325 MG per tablet   Oral   Take 1-2 tablets by mouth every 6 (six) hours as needed for pain.   20 tablet   0   . pravastatin (PRAVACHOL) 10 MG tablet   Oral   Take 10 mg by mouth daily.         . predniSONE (DELTASONE) 5 MG tablet   Oral   Take 5 mg by mouth daily.         Marland Kitchen sulfamethoxazole-trimethoprim (BACTRIM,SEPTRA) 400-80 MG per tablet   Oral   Take 1 tablet by mouth  every Monday, Wednesday, and Friday.         . vitamin C (ASCORBIC ACID) 500 MG tablet   Oral   Take 500 mg by mouth daily.         . vitamin E 400 UNIT capsule   Oral   Take 400 Units by mouth daily.          BP 153/83  Pulse 95  Temp(Src) 98.7 F (37.1 C) (Oral)  Resp 18  Ht 5\' 6"  (1.676 m)  Wt 170 lb 8 oz (77.338 kg)  BMI 27.53 kg/m2  SpO2 99%  LMP 03/02/2010 Physical Exam  Nursing note and vitals reviewed. Constitutional: She is oriented to person, place, and time. She appears well-developed and well-nourished.  HENT:  Head: Normocephalic and atraumatic.  Right Ear: External ear normal.  Left Ear: External ear normal.  Mouth/Throat: Oropharynx is clear and moist.  Eyes: Conjunctivae and EOM are normal. Pupils are equal, round, and reactive to light.  Neck: Normal range of motion.  Cardiovascular: Normal rate, regular rhythm and normal heart sounds.   Pulmonary/Chest: Effort normal and breath sounds normal.  Abdominal: Soft. She exhibits no distension.  Musculoskeletal: Normal range of motion.  Neurological: She is alert and oriented to person, place, and time.  Skin: Skin is warm.  Psychiatric: She has a normal mood and affect.   Pt has a history of a lung transplant.  No problems.   ED Course  Procedures (including critical care time) Labs Review Labs Reviewed  [place], ED   Imaging Review No results found.  EKG Interpretation    Date/Time:  Friday May 16 2013 09:54:17 EST Ventricular Rate:  81 PR Interval:  123 QRS Duration: 90 QT Interval:  350 QTC Calculation: 406 R Axis:   85 Text Interpretation:  Sinus rhythm Normal ECG Confirmed by POLLINA  MD, CHRISTOPHER ([id number]) on 05/16/2013 9:58:39 AM  Pt does not know her pulmonologist name.  She is followed at [facility].  I spoke to transplant coordinator and to Dr. Doy Mince her pulmonologist at [facility].   We will do Ct  angio of chest.   If ct is negative.   He advised to cover with tamiflu 75mg  bid x 10 days MDM   Final diagnoses:  None    Pt's care turned over to Dr. Stevie Kern  Ct pending.    [place], PA-C 05/16/13 [phone]

## 2013-05-16 NOTE — ED Notes (Signed)
Pt. Eating dinner

## 2013-05-16 NOTE — ED Notes (Signed)
Pt states "my bronchial tubes are on fire and I have a headache." she states the symptoms started yesterday and her chest feels tight. Shes a bilateral lung transplant pt

## 2013-05-19 LAB — RESPIRATORY VIRUS PANEL
ADENOVIRUS: NOT DETECTED
Influenza A H1: NOT DETECTED
Influenza A H3: NOT DETECTED
Influenza A: DETECTED — AB
Influenza B: NOT DETECTED
Metapneumovirus: NOT DETECTED
Parainfluenza 1: NOT DETECTED
Parainfluenza 2: NOT DETECTED
Parainfluenza 3: NOT DETECTED
Respiratory Syncytial Virus A: NOT DETECTED
Respiratory Syncytial Virus B: NOT DETECTED
Rhinovirus: NOT DETECTED

## 2013-06-02 ENCOUNTER — Encounter: Payer: Medicare Other | Admitting: Family Medicine

## 2015-08-30 DIAGNOSIS — N289 Disorder of kidney and ureter, unspecified: Secondary | ICD-10-CM | POA: Diagnosis not present

## 2015-08-30 DIAGNOSIS — R799 Abnormal finding of blood chemistry, unspecified: Secondary | ICD-10-CM | POA: Diagnosis not present

## 2015-08-30 DIAGNOSIS — N183 Chronic kidney disease, stage 3 (moderate): Secondary | ICD-10-CM | POA: Diagnosis not present

## 2015-08-30 DIAGNOSIS — Z79899 Other long term (current) drug therapy: Secondary | ICD-10-CM | POA: Diagnosis not present

## 2015-08-30 DIAGNOSIS — D899 Disorder involving the immune mechanism, unspecified: Secondary | ICD-10-CM | POA: Diagnosis not present

## 2015-08-30 DIAGNOSIS — Z942 Lung transplant status: Secondary | ICD-10-CM | POA: Diagnosis not present

## 2015-08-30 DIAGNOSIS — Z5181 Encounter for therapeutic drug level monitoring: Secondary | ICD-10-CM | POA: Diagnosis not present

## 2015-08-30 DIAGNOSIS — I129 Hypertensive chronic kidney disease with stage 1 through stage 4 chronic kidney disease, or unspecified chronic kidney disease: Secondary | ICD-10-CM | POA: Diagnosis not present

## 2015-08-30 DIAGNOSIS — Z4824 Encounter for aftercare following lung transplant: Secondary | ICD-10-CM | POA: Diagnosis not present

## 2015-08-30 DIAGNOSIS — C439 Malignant melanoma of skin, unspecified: Secondary | ICD-10-CM | POA: Diagnosis not present

## 2015-10-06 DIAGNOSIS — H35033 Hypertensive retinopathy, bilateral: Secondary | ICD-10-CM | POA: Diagnosis not present

## 2015-11-19 DIAGNOSIS — C44622 Squamous cell carcinoma of skin of right upper limb, including shoulder: Secondary | ICD-10-CM | POA: Diagnosis not present

## 2016-01-21 DIAGNOSIS — Z942 Lung transplant status: Secondary | ICD-10-CM | POA: Diagnosis not present

## 2016-02-14 DIAGNOSIS — Z942 Lung transplant status: Secondary | ICD-10-CM | POA: Diagnosis not present

## 2016-03-01 DIAGNOSIS — Z942 Lung transplant status: Secondary | ICD-10-CM | POA: Diagnosis not present

## 2016-03-16 DIAGNOSIS — C44622 Squamous cell carcinoma of skin of right upper limb, including shoulder: Secondary | ICD-10-CM | POA: Diagnosis not present

## 2016-04-01 DIAGNOSIS — Z23 Encounter for immunization: Secondary | ICD-10-CM | POA: Diagnosis not present

## 2016-05-10 DIAGNOSIS — C4359 Malignant melanoma of other part of trunk: Secondary | ICD-10-CM | POA: Diagnosis not present

## 2016-05-10 DIAGNOSIS — Z942 Lung transplant status: Secondary | ICD-10-CM | POA: Diagnosis not present

## 2016-05-10 DIAGNOSIS — T86818 Other complications of lung transplant: Secondary | ICD-10-CM | POA: Diagnosis not present

## 2016-05-10 DIAGNOSIS — Z4824 Encounter for aftercare following lung transplant: Secondary | ICD-10-CM | POA: Diagnosis not present

## 2016-05-10 DIAGNOSIS — Z5181 Encounter for therapeutic drug level monitoring: Secondary | ICD-10-CM | POA: Diagnosis not present

## 2016-05-10 DIAGNOSIS — N183 Chronic kidney disease, stage 3 (moderate): Secondary | ICD-10-CM | POA: Diagnosis not present

## 2016-05-10 DIAGNOSIS — I10 Essential (primary) hypertension: Secondary | ICD-10-CM | POA: Diagnosis not present

## 2016-05-10 DIAGNOSIS — D899 Disorder involving the immune mechanism, unspecified: Secondary | ICD-10-CM | POA: Diagnosis not present

## 2016-05-10 DIAGNOSIS — I129 Hypertensive chronic kidney disease with stage 1 through stage 4 chronic kidney disease, or unspecified chronic kidney disease: Secondary | ICD-10-CM | POA: Diagnosis not present

## 2017-04-21 DIAGNOSIS — Z942 Lung transplant status: Secondary | ICD-10-CM | POA: Diagnosis not present

## 2017-04-21 DIAGNOSIS — Z5181 Encounter for therapeutic drug level monitoring: Secondary | ICD-10-CM | POA: Diagnosis not present

## 2017-04-21 DIAGNOSIS — T86819 Unspecified complication of lung transplant: Secondary | ICD-10-CM | POA: Diagnosis not present

## 2017-05-11 DIAGNOSIS — I129 Hypertensive chronic kidney disease with stage 1 through stage 4 chronic kidney disease, or unspecified chronic kidney disease: Secondary | ICD-10-CM | POA: Diagnosis not present

## 2017-05-11 DIAGNOSIS — Z79899 Other long term (current) drug therapy: Secondary | ICD-10-CM | POA: Diagnosis not present

## 2017-05-11 DIAGNOSIS — J9811 Atelectasis: Secondary | ICD-10-CM | POA: Diagnosis not present

## 2017-05-11 DIAGNOSIS — Z942 Lung transplant status: Secondary | ICD-10-CM | POA: Diagnosis not present

## 2017-05-11 DIAGNOSIS — T86819 Unspecified complication of lung transplant: Secondary | ICD-10-CM | POA: Diagnosis not present

## 2017-05-11 DIAGNOSIS — Z4824 Encounter for aftercare following lung transplant: Secondary | ICD-10-CM | POA: Diagnosis not present

## 2017-05-11 DIAGNOSIS — Z85820 Personal history of malignant melanoma of skin: Secondary | ICD-10-CM | POA: Diagnosis not present

## 2017-05-11 DIAGNOSIS — N183 Chronic kidney disease, stage 3 (moderate): Secondary | ICD-10-CM | POA: Diagnosis not present

## 2017-05-11 DIAGNOSIS — I10 Essential (primary) hypertension: Secondary | ICD-10-CM | POA: Diagnosis not present

## 2017-05-11 DIAGNOSIS — Z9889 Other specified postprocedural states: Secondary | ICD-10-CM | POA: Diagnosis not present

## 2017-05-21 ENCOUNTER — Other Ambulatory Visit: Payer: Self-pay

## 2017-05-21 ENCOUNTER — Ambulatory Visit (HOSPITAL_COMMUNITY)
Admission: EM | Admit: 2017-05-21 | Discharge: 2017-05-21 | Disposition: A | Discharge status: Home / Self Care | Payer: BLUE CROSS/BLUE SHIELD | Attending: Family Medicine | Admitting: Family Medicine

## 2017-05-21 ENCOUNTER — Encounter (HOSPITAL_COMMUNITY): Payer: Self-pay | Admitting: Emergency Medicine

## 2017-05-21 DIAGNOSIS — I10 Essential (primary) hypertension: Secondary | ICD-10-CM

## 2017-05-21 MED ORDER — CHLORTHALIDONE 25 MG PO TABS: 12.5000 mg | ORAL_TABLET | Freq: Every day | ORAL | 0 refills | Status: DC

## 2017-05-21 NOTE — ED Provider Notes (Signed)
[place]    CSN: [id number] Arrival date & time: 05/21/17  1555  Chief Complaint  Patient presents with  . Hypertension    Subjective Krista Ellison is a 62 y.o. female who presents for hypertension follow up. She does not monitor home blood pressures. Blood pressures in doc's visits at at pharm have been in 160's/90-100's She is compliant with medication- recently changed to Norvasc 5 mg from lisinopril 10 mg/d. Patient has these side effects of medication: headaches, malaise She is adhering to a healthy diet overall. No new supplements, recent travel, other med changes. Denies CP or SOB.   Past Medical History:  Diagnosis Date  . Alpha-1-antitrypsin deficiency ([place])    s/p double lung transplant  . Arthritis   . Bronchitis    hx of  . Cancer ([place])    skin x 2  . COPD (chronic obstructive pulmonary disease) ([facility])    hx of. Had lung transplant  . Dizziness   . Hyperlipidemia    takes Pravastatin  . Hypertension    Takes Lisinopril  . Left radial fracture   . Pneumonia    hx of   Family History  Problem Relation Age of Onset  . Diabetes Father   . Hypertension Father   . Asthma Father   . Heart disease Father   . Hyperlipidemia Father   . Thyroid disease Mother   . Cancer Mother        liver, hodgkins, skin  . Thyroid disease Sister   . Anesthesia problems Neg Hx   . Hypotension Neg Hx   . Pseudochol deficiency Neg Hx   . Malignant hyperthermia Neg Hx     Medications No current facility-administered medications on file prior to encounter.    Current Outpatient Medications on File Prior to Encounter  Medication Sig Dispense Refill  . acyclovir (ZOVIRAX) 400 MG tablet Take 400 mg by mouth 2 (two) times daily.     Marland Kitchen b complex vitamins tablet Take 1 tablet by mouth daily.    . calcium citrate (CALCITRATE - DOSED IN MG ELEMENTAL CALCIUM) 950 MG tablet Take 1 tablet by mouth daily.    . cholecalciferol (VITAMIN D) 1000 UNITS tablet Take  2,000 Units by mouth daily.    . cyclobenzaprine (FLEXERIL) 10 MG tablet Take 1 tablet (10 mg total) by mouth 2 (two) times daily as needed for muscle spasms. 20 tablet 0  . cycloSPORINE modified (GENGRAF) 100 MG capsule Take 100 mg by mouth 2 (two) times daily. Take with #2  25 mg tablets for a 150 mg dose    . cycloSPORINE modified (GENGRAF) 25 MG capsule Take 50 mg by mouth 2 (two) times daily. Take with 100 mg tablet for a 150 mg dose    . fish oil-omega-3 fatty acids 1000 MG capsule Take 2 g by mouth daily.    Marland Kitchen HYDROcodone-acetaminophen (NORCO/VICODIN) 5-325 MG per tablet Take 2 tablets by mouth every 4 (four) hours as needed for moderate pain. 20 tablet 0  . Magnesium Hydroxide (MAGNESIA PO) Take 400 mg by mouth daily.    . mycophenolate (CELLCEPT) 500 MG tablet Take 500 mg by mouth 2 (two) times daily.    Marland Kitchen oxyCODONE-acetaminophen (PERCOCET/ROXICET) 5-325 MG per tablet Take 1-2 tablets by mouth every 6 (six) hours as needed for pain. 20 tablet 0  . pravastatin (PRAVACHOL) 10 MG tablet Take 10 mg by mouth daily.    . predniSONE (DELTASONE) 5 MG tablet Take 5 mg by mouth  daily.    . sulfamethoxazole-trimethoprim (BACTRIM,SEPTRA) 400-80 MG per tablet Take 1 tablet by mouth every Monday, Wednesday, and Friday.    . vitamin E 400 UNIT capsule Take 400 Units by mouth daily.      Allergies Allergies  Allergen Reactions  . Rapamune [Sirolimus]     Review of Systems Cardiovascular: no chest pain Respiratory:  no shortness of breath  Exam BP (!) 180/93 (BP Location: Left Arm) Comment: notified mani, pa of blood pressure  Pulse 93   Temp 98.1 F (36.7 C) (Oral)   Resp (!) 22   LMP 03/02/2010   SpO2 97%  General:  well developed, well nourished, in no apparent distress Skin: warm, no pallor or diaphoresis Eyes: pupils equal and round, sclera anicteric without injection Heart: RRR, no bruits, 2+ pitting B/L LE edema Lungs: clear to auscultation, no accessory muscle use Psych: well  oriented with normal range of affect and appropriate judgment/insight  Essential hypertension  Stop Norvasc. CareEverywhere reviewed, last K was 5.1 on 2/15. Will start Chlorthalidone 12.5 mg/d, f/u with pcp to have labs and BP rechecked. Go to ER if starting to have CP, SOB, or confusion. Counseled on diet and exercise briefly. The patient voiced understanding and agreement to the plan.    Shelda Pal, [place] 05/21/17 1815

## 2017-05-21 NOTE — Discharge Instructions (Signed)
Check blood pressures at home.  Go to ER if you start having chest pain or shortness of breath.  Find a PCP ASAP.

## 2017-05-21 NOTE — ED Triage Notes (Signed)
Concerned for blood pressure running high.  At most recent provider visit blood pressure medicine was changed.  Reports not feeling well, swelling in legs.  Patient called her doctor at [facility] and they recommended coming to urgent care.

## 2017-07-12 ENCOUNTER — Ambulatory Visit (HOSPITAL_COMMUNITY)
Admission: EM | Admit: 2017-07-12 | Discharge: 2017-07-12 | Disposition: A | Discharge status: Home / Self Care | Payer: BLUE CROSS/BLUE SHIELD | Attending: Family Medicine | Admitting: Family Medicine

## 2017-07-12 ENCOUNTER — Other Ambulatory Visit: Payer: Self-pay | Admitting: Family Medicine

## 2017-07-12 ENCOUNTER — Encounter (HOSPITAL_COMMUNITY): Payer: Self-pay | Admitting: Family Medicine

## 2017-07-12 ENCOUNTER — Telehealth (HOSPITAL_COMMUNITY): Payer: Self-pay | Admitting: Emergency Medicine

## 2017-07-12 DIAGNOSIS — I10 Essential (primary) hypertension: Secondary | ICD-10-CM | POA: Diagnosis not present

## 2017-07-12 DIAGNOSIS — Z760 Encounter for issue of repeat prescription: Secondary | ICD-10-CM

## 2017-07-12 MED ORDER — CHLORTHALIDONE 25 MG PO TABS: 25.0000 mg | ORAL_TABLET | Freq: Every day | ORAL | 0 refills | Status: DC

## 2017-07-12 NOTE — ED Triage Notes (Signed)
Pt here for BP med refill. She was given Chlorthalidone here but is about to run out. She has PCP appointment in May.

## 2017-07-12 NOTE — ED Provider Notes (Signed)
[place]    CSN: [id number] Arrival date & time: 07/12/17  1539     History   Chief Complaint Chief Complaint  Patient presents with  . Medication Refill    HPI Krista Ellison is a 62 y.o. female.   62 year old female, with history of hypertension, hyperlipidemia, COPD, status post double lung transplants secondary to alpha-1 antitrypsin deficiency, presenting today for medication refill.  Patient was seen in the office about a month ago due to high blood pressure.  Was started on chlorthalidone at that time, 12.5 mg. She is about to run out and came in today for a refill. She states that her blood pressure has remained elevated in the mid to high 494W systolic. She is asking to increase her dose. She has an upcoming appointment with her new PCP in the next month   The history is provided by the patient.  Medication Refill  Medications/supplies requested:  Chlorthalidone 12.5 mg Reason for request:  Clinic/provider not available Medications taken before: yes - see home medications   Patient has complete original prescription information: yes     Past Medical History:  Diagnosis Date  . Alpha-1-antitrypsin deficiency ([place])    s/p double lung transplant  . Arthritis   . Bronchitis    hx of  . Cancer ([place])    skin x 2  . COPD (chronic obstructive pulmonary disease) ([facility])    hx of. Had lung transplant  . Dizziness   . Hyperlipidemia    takes Pravastatin  . Hypertension    Takes Lisinopril  . Left radial fracture   . Pneumonia    hx of    Patient Active Problem List   Diagnosis Date Noted  . Superficial thrombophlebitis 07/04/2011  . Status post transplant, lung ([place]) 07/04/2011  . Osteoporosis/osteopenia increased risk 07/04/2011  . Hypertension 07/04/2011  . Hyperlipidemia 07/04/2011    Past Surgical History:  Procedure Laterality Date  . [place]   Clean cath  . DILATION AND CURETTAGE OF UTERUS    . LUNG  TRANSPLANT, DOUBLE  2002  . WRIST SURGERY  05/2011   radial fx    OB History   None      Home Medications    Prior to Admission medications   Medication Sig Start Date End Date Taking? Authorizing Provider  acyclovir (ZOVIRAX) 400 MG tablet Take 400 mg by mouth 2 (two) times daily.     [provider]  b complex vitamins tablet Take 1 tablet by mouth daily.    [provider]  calcium citrate (CALCITRATE - DOSED IN MG ELEMENTAL CALCIUM) 950 MG tablet Take 1 tablet by mouth daily.    [provider]  chlorthalidone (HYGROTON) 25 MG tablet Take 1 tablet (25 mg total) by mouth daily. 07/12/17 08/11/17  Jaylenn Baiza C, PA-C  cholecalciferol (VITAMIN D) 1000 UNITS tablet Take 2,000 Units by mouth daily.    [provider]  cyclobenzaprine (FLEXERIL) 10 MG tablet Take 1 tablet (10 mg total) by mouth 2 (two) times daily as needed for muscle spasms. 01/21/12   Fredia Sorrow, MD  cycloSPORINE modified (GENGRAF) 100 MG capsule Take 100 mg by mouth 2 (two) times daily. Take with #2  25 mg tablets for a 150 mg dose    [provider]  cycloSPORINE modified (GENGRAF) 25 MG capsule Take 50 mg by mouth 2 (two) times daily. Take with 100 mg tablet for a 150 mg dose    [provider]  fish oil-omega-3 fatty acids 1000 MG capsule Take 2 g by mouth daily.    [provider]  HYDROcodone-acetaminophen (NORCO/VICODIN) 5-325 MG per tablet Take 2 tablets by mouth every 4 (four) hours as needed for moderate pain. 05/16/13   Fransico Meadow, PA-C  Magnesium Hydroxide (MAGNESIA PO) Take 400 mg by mouth daily.    [provider]  mycophenolate (CELLCEPT) 500 MG tablet Take 500 mg by mouth 2 (two) times daily.    [provider]  oxyCODONE-acetaminophen (PERCOCET/ROXICET) 5-325 MG per tablet Take 1-2 tablets by mouth every 6 (six) hours as needed for pain. 01/21/12   Fredia Sorrow, MD  pravastatin (PRAVACHOL) 10 MG tablet Take 10 mg  by mouth daily.    [provider]  predniSONE (DELTASONE) 5 MG tablet Take 5 mg by mouth daily.    [provider]  sulfamethoxazole-trimethoprim (BACTRIM,SEPTRA) 400-80 MG per tablet Take 1 tablet by mouth every Monday, Wednesday, and Friday.    [provider]  vitamin E 400 UNIT capsule Take 400 Units by mouth daily.    [provider]    Family History Family History  Problem Relation Age of Onset  . Diabetes Father   . Hypertension Father   . Asthma Father   . Heart disease Father   . Hyperlipidemia Father   . Thyroid disease Mother   . Cancer Mother        liver, hodgkins, skin  . Thyroid disease Sister   . Anesthesia problems Neg Hx   . Hypotension Neg Hx   . Pseudochol deficiency Neg Hx   . Malignant hyperthermia Neg Hx     Social History Social History   Tobacco Use  . Smoking status: Former Smoker    Packs/day: 1.50    Years: 17.00    Pack years: 25.50    Types: Cigarettes  . Smokeless tobacco: Never Used  Substance Use Topics  . Alcohol use: Yes    Alcohol/week: 3.5 oz    Types: 7 Standard drinks or equivalent per week  . Drug use: No     Allergies   Rapamune [sirolimus]   Review of Systems Review of Systems  Constitutional: Negative for chills and fever.  HENT: Negative for congestion, dental problem, drooling, ear pain, facial swelling, mouth sores and sore throat.   Eyes: Negative for pain and visual disturbance.  Respiratory: Negative for cough and shortness of breath.   Cardiovascular: Negative for chest pain and palpitations.  Gastrointestinal: Negative for abdominal pain and vomiting.  Genitourinary: Negative for dysuria and hematuria.  Musculoskeletal: Negative for arthralgias, back pain and neck pain.  Skin: Negative for color change and rash.  Neurological: Negative for seizures, syncope and headaches.  All other systems reviewed and are negative.    Physical Exam Triage Vital Signs ED Triage  Vitals  Enc Vitals Group     BP 07/12/17 1606 (!) 163/99     Pulse Rate 07/12/17 1606 77     Resp 07/12/17 1606 20     Temp 07/12/17 1606 98.5 F (36.9 C)     Temp src --      SpO2 07/12/17 1606 98 %     Weight --      Height --      Head Circumference --      Peak Flow --      Pain Score 07/12/17 1604 0     Pain Loc --      Pain Edu? --  Excl. in GC? --    No data found.  Updated Vital Signs BP (!) 163/99   Pulse 77   Temp 98.5 F (36.9 C)   Resp 20   LMP 03/02/2010   SpO2 98%   Visual Acuity Right Eye Distance:   Left Eye Distance:   Bilateral Distance:    Right Eye Near:   Left Eye Near:    Bilateral Near:     Physical Exam  Constitutional: She appears well-developed and well-nourished. No distress.  HENT:  Head: Normocephalic and atraumatic.  Eyes: Conjunctivae are normal.  Neck: Neck supple.  Cardiovascular: Normal rate and regular rhythm.  No murmur heard. Pulmonary/Chest: Effort normal and breath sounds normal. No respiratory distress.  Abdominal: Soft. There is no tenderness.  Musculoskeletal: She exhibits no edema.  Neurological: She is alert.  Skin: Skin is warm and dry.  Psychiatric: She has a normal mood and affect.  Nursing note and vitals reviewed.    UC Treatments / Results  Labs (all labs ordered are listed, but only abnormal results are displayed) Labs Reviewed - No data to display  EKG None Radiology No results found.  Procedures Procedures (including critical care time)  Medications Ordered in UC Medications - No data to display   Initial Impression / Assessment and Plan / UC Course  I have reviewed the triage vital signs and the nursing notes.  Pertinent labs & imaging results that were available during my care of the patient were reviewed by me and considered in my medical decision making (see chart for details).     Patient here for medication refill.  Is asymptomatic.  Is noted to be persistently hypertensive.   Recommended increasing her dose of chlorthalidone to 25 mg or 12.5.  She will follow-up with her primary care doctor at her upcoming scheduled appointment.  Final Clinical Impressions(s) / UC Diagnoses   Final diagnoses:  Encounter for medication refill  Essential hypertension    ED Discharge Orders        Ordered    chlorthalidone (HYGROTON) 25 MG tablet  Daily,   Status:  Discontinued     07/12/17 1615       Controlled Substance Prescriptions [place] Controlled Substance Registry consulted? Not Applicable   Phebe Colla, [place] 07/12/17 1625

## 2017-08-03 ENCOUNTER — Other Ambulatory Visit: Payer: Self-pay

## 2017-08-03 ENCOUNTER — Encounter: Payer: Self-pay | Admitting: Family Medicine

## 2017-08-03 ENCOUNTER — Ambulatory Visit (INDEPENDENT_AMBULATORY_CARE_PROVIDER_SITE_OTHER): Payer: BLUE CROSS/BLUE SHIELD | Admitting: Family Medicine

## 2017-08-03 VITALS — BP 122/81 | HR 73 | Temp 98.2°F | Ht 66.0 in | Wt 168.0 lb

## 2017-08-03 DIAGNOSIS — Z1231 Encounter for screening mammogram for malignant neoplasm of breast: Secondary | ICD-10-CM | POA: Diagnosis not present

## 2017-08-03 DIAGNOSIS — E8801 Alpha-1-antitrypsin deficiency: Secondary | ICD-10-CM | POA: Insufficient documentation

## 2017-08-03 DIAGNOSIS — H269 Unspecified cataract: Secondary | ICD-10-CM | POA: Diagnosis not present

## 2017-08-03 DIAGNOSIS — I10 Essential (primary) hypertension: Secondary | ICD-10-CM

## 2017-08-03 DIAGNOSIS — N289 Disorder of kidney and ureter, unspecified: Secondary | ICD-10-CM | POA: Insufficient documentation

## 2017-08-03 DIAGNOSIS — J438 Other emphysema: Secondary | ICD-10-CM

## 2017-08-03 DIAGNOSIS — C439 Malignant melanoma of skin, unspecified: Secondary | ICD-10-CM | POA: Insufficient documentation

## 2017-08-03 DIAGNOSIS — I8390 Asymptomatic varicose veins of unspecified lower extremity: Secondary | ICD-10-CM | POA: Insufficient documentation

## 2017-08-03 DIAGNOSIS — R7303 Prediabetes: Secondary | ICD-10-CM

## 2017-08-03 MED ORDER — CHLORTHALIDONE 25 MG PO TABS: 25.0000 mg | ORAL_TABLET | Freq: Every day | ORAL | 1 refills | Status: DC

## 2017-08-03 NOTE — Progress Notes (Signed)
Subjective:  Krista Ellison is a 62 y.o. female who presents to the [facility] today to establish care.  HPI:  Previous PCP was in [place]. Since moving to [place] has  Been followed by [facility] pulmonology given her h/o lung transplant.   HTN She has been seen at urgent care for her HTN med refills.  Was previously on lisinopril which controlled her blood pressure well however her last doctor noticed that it had caused her potassium to be high.  She had been tried on amlodipine which caused worsening leg swelling.  She was started on chlorthalidone initially on 12.5 mg daily for her blood pressure was still elevated so had to be increased to 25 mg daily. Needs refills She states that on this dose that her blood pressure has been under good control at home.  She does not have any headaches, chest pain, shortness of breath, lightheadedness, or dizziness. She states that she recently had blood work in February. She states that she does have some lower extremity edema at baseline secondary to her chronic steroid use and her varicose veins.  Her varicose veins are very painful.   ROS: Per HPI, otherwise all systems reviewed and negative  PMH:  The following were reviewed and entered/updated in epic: Past Medical History:  Diagnosis Date  . Alpha-1-antitrypsin deficiency ([place])    s/p double lung transplant  . Arthritis   . Bronchitis    hx of  . Cancer ([place])    skin x 2  . COPD (chronic obstructive pulmonary disease) (H[facility])    hx of. Had lung transplant  . Dizziness   . Hyperlipidemia    takes Pravastatin  . Hypertension    Takes Lisinopril  . Left radial fracture   . Pneumonia    hx of   Patient Active Problem List   Diagnosis Date Noted  . Emphysema due to alpha-1-antitrypsin deficiency ([place]) 08/03/2017  . Varicose vein of leg 08/03/2017  . Melanoma ([place]) 08/03/2017  . Renal insufficiency 08/03/2017  . Prediabetes 08/03/2017  . History of nonmelanoma skin cancer 08/10/2011    . Osteoporosis/osteopenia increased risk 07/04/2011  . Essential hypertension 07/04/2011  . Hypercholesterolemia 07/04/2011  . Immunosuppression ([place]) 06/14/2011  . 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 [place]   Clean cath  . DILATION AND CURETTAGE OF UTERUS    . LUNG TRANSPLANT, DOUBLE  2002  . WRIST SURGERY  05/2011   radial fx    Family History  Problem Relation Age of Onset  . Diabetes Father   . Hypertension Father   . Asthma Father   . Heart disease Father   . Hyperlipidemia Father   . High blood pressure Father   . Kidney disease Father        was on dialysis  . Thyroid disease Mother   . Cancer Mother        liver, hodgkins, skin  . Thyroid disease Sister        after radiation for hodgkins  . Anesthesia problems Neg Hx   . Hypotension Neg Hx   . Pseudochol deficiency Neg Hx   . Malignant hyperthermia Neg Hx     Medications- reviewed and updated Current Outpatient Medications  Medication Sig Dispense Refill  . cycloSPORINE modified (NEORAL) 100 MG capsule Take 125 mg by mouth 2 (two) times daily.    . Probiotic Product (PROBIOTIC DAILY PO) Take 1 capsule by  mouth daily.    Marland Kitchen acyclovir (ZOVIRAX) 400 MG tablet Take 400 mg by mouth 2 (two) times daily.     . chlorthalidone (HYGROTON) 25 MG tablet Take 1 tablet (25 mg total) by mouth daily. 30 tablet 0  . cholecalciferol (VITAMIN D) 1000 UNITS tablet Take 2,000 Units by mouth daily.    . fish oil-omega-3 fatty acids 1000 MG capsule Take 2 g by mouth daily.    . mycophenolate (CELLCEPT) 500 MG tablet Take 500 mg by mouth 2 (two) times daily.    . pravastatin (PRAVACHOL) 10 MG tablet Take 10 mg by mouth daily.    . predniSONE (DELTASONE) 5 MG tablet Take 5 mg by mouth daily.    Marland Kitchen sulfamethoxazole-trimethoprim (BACTRIM,SEPTRA) 400-80 MG per tablet Take 1 tablet by mouth every Monday, Wednesday, and Friday.     No current  facility-administered medications for this visit.     Allergies-reviewed and updated Allergies  Allergen Reactions  . Amlodipine     Leg swelling  . Lisinopril     Hyperkalemia  . Rapamune [Sirolimus]     Social History   Socioeconomic History  . Marital status: Single    Spouse name: Not on file  . Number of children: Not on file  . Years of education: Not on file  . Highest education level: Not on file  Occupational History  . Not on file  Social Needs  . Financial resource strain: Not on file  . Food insecurity:    Worry: Not on file    Inability: Not on file  . Transportation needs:    Medical: Not on file    Non-medical: Not on file  Tobacco Use  . Smoking status: Former Smoker    Packs/day: 1.50    Years: 17.00    Pack years: 25.50    Types: Cigarettes    Last attempt to quit: 1999    Years since quitting: 20.3  . Smokeless tobacco: Never Used  Substance and Sexual Activity  . Alcohol use: Yes    Alcohol/week: 3.5 oz    Types: 7 Standard drinks or equivalent per week  . Drug use: No    Comment: remotely in past 1990s marijuana and cocaine, none since.  . Sexual activity: Not Currently  Lifestyle  . Physical activity:    Days per week: 1 day    Minutes per session: Not on file  . Stress: Not on file  Relationships  . Social connections:    Talks on phone: Not on file    Gets together: Not on file    Attends religious service: Not on file    Active member of club or organization: Not on file    Attends meetings of clubs or organizations: Not on file    Relationship status: Not on file  Other Topics Concern  . Not on file  Social History Narrative   Moved from [place] after divorce in 11/2010 to be closer to her son and grandson in [place]. Works in Ambulance person at [employer]. Former smoker x 17 years, quit in 1999.    Likes to hike and walk.     Objective:  Physical Exam: BP 122/81   Pulse 73   Temp 98.2 F (36.8 C) (Oral)    Ht 5\' 6"  (1.676 m)   Wt 168 lb (76.2 kg)   LMP 03/02/2010   SpO2 97%   BMI 27.12 kg/m   Gen: NAD, resting comfortably CV: RRR with no murmurs appreciated Pulm:  NWOB, CTAB with no crackles, wheezes, or rhonchi GI: Normal bowel sounds present. Soft, Nontender, Nondistended. MSK: Compression hose in place on legs with some palpable prominent tortuous veins especially on left shin.  No significant edema appreciated through compression hose. No cyanosis, or clubbing noted Skin: warm, dry  Neuro: grossly normal, moves all extremities Psych: Normal affect and thought content   Assessment/Plan:  Essential hypertension Controlled and doing well on chlorthalidone 25 mg daily.  She has had recent blood work in February.  Showing stable creatinine  with her chronic kidney disease.  We will refill her medication and recheck BMP in 1 month.   Cataract Has not seen an eye doctor in the last here, wants to establish locally.  She recalls being told in the past that she may have a cataract developing in one eye but she is not sure which.  Referral placed today  Health maintenance Discussed healthy lifestyle and diet. Mammogram ordered today  Bufford Lope, DO PGY-2, [facility] 08/03/2017 2:40 PM

## 2017-08-03 NOTE — Patient Instructions (Addendum)
Come back at your convenience to discuss your other concerns.   I have placed a referral for a eye doctor. Someone will call you to schedule, if you do not hear back after 2 weeks.   Please get your mammmogram     Diet Recommendations for Diabetes  Carbohydrate includes starch, sugar, and fiber.  Of these, only sugar and starch raise blood glucose.  (Fiber is found in fruits, vegetables [especially skin, seeds, and stalks] and whole grains.)   Starchy (carb) foods: Bread, rice, pasta, potatoes, corn, cereal, grits, crackers, bagels, muffins, all baked goods.  (Fruit, milk, and yogurt also have carbohydrate, but most of these foods will not spike your blood sugar as most starchy foods will.)  A few fruits do cause high blood sugars; use small portions of bananas (limit to 1/2 at a time), grapes, watermelon, oranges, and most tropical fruits.   Protein foods: Meat, fish, poultry, eggs, dairy foods, and beans such as pinto and kidney beans (beans also provide carbohydrate).   Limit starchy foods to TWO per meal and ONE per snack. ONE portion of a starchy  food is equal to the following:   - ONE slice of bread (or its equivalent, such as half of a hamburger bun).   - 1/2 cup of a "scoopable" starchy food such as potatoes or rice.   - 15 grams of Total Carbohydrate as shown on food label.

## 2017-08-03 NOTE — Assessment & Plan Note (Signed)
Has not seen an eye doctor in the last here, wants to establish locally.  She recalls being told in the past that she may have a cataract developing in one eye but she is not sure which.  Referral placed today

## 2017-08-03 NOTE — Assessment & Plan Note (Signed)
Controlled and doing well on chlorthalidone 25 mg daily.  She has had recent blood work in February.  Showing stable creatinine  with her chronic kidney disease.  We will refill her medication and recheck BMP in 1 month.

## 2017-11-08 DIAGNOSIS — B078 Other viral warts: Secondary | ICD-10-CM | POA: Diagnosis not present

## 2017-11-08 DIAGNOSIS — Z85820 Personal history of malignant melanoma of skin: Secondary | ICD-10-CM | POA: Diagnosis not present

## 2017-11-08 DIAGNOSIS — D049 Carcinoma in situ of skin, unspecified: Secondary | ICD-10-CM | POA: Diagnosis not present

## 2017-11-08 DIAGNOSIS — D485 Neoplasm of uncertain behavior of skin: Secondary | ICD-10-CM | POA: Diagnosis not present

## 2017-11-08 DIAGNOSIS — D2270 Melanocytic nevi of unspecified lower limb, including hip: Secondary | ICD-10-CM | POA: Diagnosis not present

## 2017-11-22 ENCOUNTER — Ambulatory Visit (INDEPENDENT_AMBULATORY_CARE_PROVIDER_SITE_OTHER): Payer: BLUE CROSS/BLUE SHIELD | Admitting: Family Medicine

## 2017-11-22 ENCOUNTER — Other Ambulatory Visit: Payer: Self-pay

## 2017-11-22 VITALS — BP 152/84 | HR 80 | Temp 98.4°F | Ht 66.0 in | Wt 171.0 lb

## 2017-11-22 DIAGNOSIS — M62838 Other muscle spasm: Secondary | ICD-10-CM | POA: Diagnosis not present

## 2017-11-22 MED ORDER — DICLOFENAC SODIUM 1 % TD GEL: 4.0000 g | Freq: Four times a day (QID) | TRANSDERMAL | 0 refills | Status: DC

## 2017-11-22 MED ORDER — CYCLOBENZAPRINE HCL 10 MG PO TABS: 10.0000 mg | ORAL_TABLET | Freq: Three times a day (TID) | ORAL | 0 refills | Status: DC | PRN

## 2017-11-22 NOTE — Assessment & Plan Note (Addendum)
Pain seems to be muscular in origin.  No tingling, numbness, or needle prick pain suggesting neuropathic in origin.  No cardiac symptoms suggesting angina.  Will prescribe short course of Flexeril for symptomatic relief.  Warned patient that this medication can make her sleepy, so cautioned her not to take this medication before driving.  Also prescribed Voltaren gel and recommended Tylenol and hot pad for symptomatic relief.  Asked patient to call clinic if not improved within 1 week.

## 2017-11-22 NOTE — Progress Notes (Signed)
   Subjective:    Krista Ellison - 62 y.o. female MRN [id number]  Date of birth: [birth year]  HPI  Krista Ellison is here for neck pain. 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 Hepatitis C Screening  Oct 31, 1955  . HIV Screening  09/17/1970  . PAP SMEAR  09/16/1976  . MAMMOGRAM  09/16/2005  . COLONOSCOPY  09/16/2005    -  reports that she quit smoking about 20 years ago. Her smoking use included cigarettes. She has a 25.50 pack-year smoking history. She has never used smokeless tobacco. - Review of Systems: Per HPI. - Past Medical History: Patient Active Problem List   Diagnosis Date Noted  . Neck muscle spasm 11/22/2017  . Emphysema due to alpha-1-antitrypsin deficiency ([place]) 08/03/2017  . Varicose vein of leg 08/03/2017  . Melanoma ([place]) 08/03/2017  . Renal insufficiency 08/03/2017  . Prediabetes 08/03/2017  . Cataract 08/03/2017  . History of nonmelanoma skin cancer 08/10/2011  . Osteoporosis/osteopenia increased risk 07/04/2011  . Essential hypertension 07/04/2011  . Hypercholesterolemia 07/04/2011  . Immunosuppression ([place]) 06/14/2011  . Lung transplant status, bilateral ([place]) 06/22/2000   - Medications: reviewed and updated   Objective:   Physical Exam BP (!) 152/84   Pulse 80   Temp 98.4 F (36.9 C) (Oral)   Ht 5\' 6"  (1.676 m)   Wt 171 lb (77.6 kg)   LMP 03/02/2010   SpO2 98%   BMI 27.60  kg/m  Gen: NAD, alert, cooperative with exam, appears uncomfortable at times Musculoskeletal: No visual deformity.  Some tenderness to palpation along left upper paraspinal muscle.  Reduced range of motion due to pain especially on cervical spine extension and lateral extension bilaterally. Neuro: no gross deficits.  Psych: good insight, alert and oriented        Assessment & Plan:   Neck muscle spasm Pain seems to be muscular in origin.  No tingling, numbness, or needle prick pain suggesting neuropathic in origin.  No cardiac symptoms suggesting angina.  Will prescribe short course of Flexeril for symptomatic relief.  Warned patient that this medication can make her sleepy, so cautioned her not to take this medication before driving.  Also prescribed Voltaren gel and recommended Tylenol and hot pad for symptomatic relief.  Asked patient to call clinic if not improved within 1 week.    Maia Breslow, M.D. 11/22/2017, 5:16 PM PGY-2, [place] [place]

## 2017-11-22 NOTE — Patient Instructions (Signed)
It was nice meeting you today Ms. Brandt-Taylor!  For your neck pain, try Flexeril up to three times per day.  Also apply voltaren gel to the affected are twice daily as needed.  You can use biofreeze, motrin, and tylenol for relief as well.  Please call in one week if you haven't had any improvement, and I will order an x-ray of your cervical spine.  If you have any questions or concerns, please feel free to call the clinic.   Be well,  Dr. Shan Levans

## 2017-11-28 DIAGNOSIS — Z4824 Encounter for aftercare following lung transplant: Secondary | ICD-10-CM | POA: Diagnosis not present

## 2017-11-28 DIAGNOSIS — Z5181 Encounter for therapeutic drug level monitoring: Secondary | ICD-10-CM | POA: Diagnosis not present

## 2017-11-28 DIAGNOSIS — M542 Cervicalgia: Secondary | ICD-10-CM | POA: Diagnosis not present

## 2017-11-28 DIAGNOSIS — Z85820 Personal history of malignant melanoma of skin: Secondary | ICD-10-CM | POA: Diagnosis not present

## 2017-11-28 DIAGNOSIS — Z9889 Other specified postprocedural states: Secondary | ICD-10-CM | POA: Diagnosis not present

## 2017-11-28 DIAGNOSIS — I10 Essential (primary) hypertension: Secondary | ICD-10-CM | POA: Diagnosis not present

## 2017-11-28 DIAGNOSIS — N183 Chronic kidney disease, stage 3 (moderate): Secondary | ICD-10-CM | POA: Diagnosis not present

## 2017-11-28 DIAGNOSIS — T86819 Unspecified complication of lung transplant: Secondary | ICD-10-CM | POA: Diagnosis not present

## 2017-11-28 DIAGNOSIS — Z942 Lung transplant status: Secondary | ICD-10-CM | POA: Diagnosis not present

## 2017-11-28 DIAGNOSIS — T86818 Other complications of lung transplant: Secondary | ICD-10-CM | POA: Diagnosis not present

## 2017-11-28 DIAGNOSIS — I129 Hypertensive chronic kidney disease with stage 1 through stage 4 chronic kidney disease, or unspecified chronic kidney disease: Secondary | ICD-10-CM | POA: Diagnosis not present

## 2017-11-29 ENCOUNTER — Ambulatory Visit
Admission: RE | Admit: 2017-11-29 | Discharge: 2017-11-29 | Disposition: A | Discharge status: Home / Self Care | Payer: BLUE CROSS/BLUE SHIELD | Source: Ambulatory Visit | Attending: Family Medicine | Admitting: Family Medicine

## 2017-11-29 DIAGNOSIS — Z1231 Encounter for screening mammogram for malignant neoplasm of breast: Secondary | ICD-10-CM | POA: Diagnosis not present

## 2017-12-01 DIAGNOSIS — T86818 Other complications of lung transplant: Secondary | ICD-10-CM | POA: Diagnosis not present

## 2017-12-01 DIAGNOSIS — Z79899 Other long term (current) drug therapy: Secondary | ICD-10-CM | POA: Diagnosis not present

## 2017-12-01 DIAGNOSIS — Z5181 Encounter for therapeutic drug level monitoring: Secondary | ICD-10-CM | POA: Diagnosis not present

## 2017-12-06 ENCOUNTER — Encounter (HOSPITAL_COMMUNITY): Payer: Self-pay

## 2017-12-06 ENCOUNTER — Ambulatory Visit (HOSPITAL_COMMUNITY)
Admission: EM | Admit: 2017-12-06 | Discharge: 2017-12-06 | Disposition: A | Discharge status: Home / Self Care | Payer: BLUE CROSS/BLUE SHIELD | Attending: Family Medicine | Admitting: Family Medicine

## 2017-12-06 DIAGNOSIS — M542 Cervicalgia: Secondary | ICD-10-CM | POA: Diagnosis not present

## 2017-12-06 MED ORDER — MELOXICAM 7.5 MG PO TABS: 7.5000 mg | ORAL_TABLET | Freq: Every day | ORAL | 0 refills | Status: AC

## 2017-12-06 MED ORDER — METHOCARBAMOL 500 MG PO TABS: 500.0000 mg | ORAL_TABLET | Freq: Three times a day (TID) | ORAL | 0 refills | Status: AC | PRN

## 2017-12-06 NOTE — ED Provider Notes (Addendum)
[facility]    CSN: [id number] Arrival date & time: 12/06/17  1552     History   Chief Complaint Chief Complaint  Patient presents with  . Neck Pain    Left    HPI Krista Ellison is a 62 y.o. female.   Krista Ellison presents with complaints of left sided neck pain which has been ongoing for the past approximate 3 weeks. Worse with movements of the head. Saw her PCP two weeks ago and was provided with flexeril and voltaren gel. Flexeril has been somewhat helpful but pain has not completely resolved. At times feels well when she first wakes but then returns once she starts working. 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 Pain 8/10. She denies any head or neck injury or trauma prior to onset. States has had "stiff necks" in the past but never this long in time. Denies chest pain , shortness of breath , numbness, tingling, difficulty in speech, vision change or headache. At times the pain becomes so severe that she feels nauseated. She took her last flexeril today. States at initial onset even had pain with movement of her left shoulder, this has felt improved. 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 Hx of htn and hyperlipidemia.      ROS per HPI.      Past Medical History:  Diagnosis Date  . Alpha-1-antitrypsin deficiency ([place])    s/p double lung transplant  . Arthritis   . Bronchitis    hx of  . Cancer ([place])    skin x 2  . COPD (chronic obstructive pulmonary disease) ([facility])    hx of. Had lung transplant  . Dizziness   . Hyperlipidemia    takes Pravastatin  . Hypertension    Takes Lisinopril  . Left radial fracture   . Pneumonia    hx of    Patient Active Problem List   Diagnosis Date Noted  . Neck muscle spasm 11/22/2017  . Emphysema due to alpha-1-antitrypsin deficiency ([place]) 08/03/2017  . Varicose vein of leg 08/03/2017  . Melanoma ([employer]) 08/03/2017  . Renal  insufficiency 08/03/2017  . Prediabetes 08/03/2017  . Cataract 08/03/2017  . History of nonmelanoma skin cancer 08/10/2011  . Osteoporosis/osteopenia increased risk 07/04/2011  . Essential hypertension 07/04/2011  . Hypercholesterolemia 07/04/2011  . Immunosuppression ([place]) 06/14/2011  . Lung transplant status, bilateral ([place]) 06/22/2000    Past Surgical History:  Procedure Laterality Date  . BREAST EXCISIONAL BIOPSY Right   . [place]   Clean cath  . DILATION AND CURETTAGE OF UTERUS    . LUNG TRANSPLANT, DOUBLE  2002  . WRIST SURGERY  05/2011   radial fx    OB History   None      Home Medications    Prior to Admission medications   Medication Sig Start Date End Date Taking? Authorizing Provider  acyclovir (ZOVIRAX) 400 MG tablet Take 400 mg by mouth 2 (two) times daily.     [provider]  chlorthalidone (HYGROTON) 25 MG tablet Take 1 tablet (25 mg total) by mouth daily. 08/03/17 09/02/17  Bufford Lope, DO  cholecalciferol (VITAMIN D) 1000 UNITS tablet Take 2,000 Units by mouth daily.    [provider]  cycloSPORINE modified (NEORAL) 100 MG capsule Take 125 mg by mouth 2 (two) times daily.    [provider]  fish oil-omega-3 fatty acids 1000 MG capsule Take 2 g by  mouth daily.    [provider]  meloxicam (MOBIC) 7.5 MG tablet Take 1 tablet (7.5 mg total) by mouth daily for 5 days. 12/06/17 12/11/17  Zigmund Gottron, NP  methocarbamol (ROBAXIN) 500 MG tablet Take 1 tablet (500 mg total) by mouth every 8 (eight) hours as needed for up to 14 days for muscle spasms. 12/06/17 12/20/17  Zigmund Gottron, NP  mycophenolate (CELLCEPT) 500 MG tablet Take 500 mg by mouth 2 (two) times daily.    [provider]  pravastatin (PRAVACHOL) 10 MG tablet Take 10 mg by mouth daily.    [provider]  predniSONE (DELTASONE) 5 MG tablet Take 5 mg by mouth daily.    [provider]  Probiotic Product (PROBIOTIC  DAILY PO) Take 1 capsule by mouth daily.    [provider]  sulfamethoxazole-trimethoprim (BACTRIM,SEPTRA) 400-80 MG per tablet Take 1 tablet by mouth every Monday, Wednesday, and Friday.    [provider]    Family History Family History  Problem Relation Age of Onset  . Diabetes Father   . Hypertension Father   . Asthma Father   . Heart disease Father   . Hyperlipidemia Father   . High blood pressure Father   . Kidney disease Father        was on dialysis  . Thyroid disease Mother   . Cancer Mother        liver, hodgkins, skin  . Thyroid disease Sister        after radiation for hodgkins  . Breast cancer Sister   . Anesthesia problems Neg Hx   . Hypotension Neg Hx   . Pseudochol deficiency Neg Hx   . Malignant hyperthermia Neg Hx     Social History Social History   Tobacco Use  . Smoking status: Former Smoker    Packs/day: 1.50    Years: 17.00    Pack years: 25.50    Types: Cigarettes    Last attempt to quit: 1999    Years since quitting: 20.7  . Smokeless tobacco: Never Used  Substance Use Topics  . Alcohol use: Yes    Alcohol/week: 7.0 standard drinks    Types: 7 Standard drinks or equivalent per week  . Drug use: No    Comment: remotely in past 1990s marijuana and cocaine, none since.     Allergies   Amlodipine; Lisinopril; and Rapamune [sirolimus]   Review of Systems Review of Systems   Physical Exam Triage Vital Signs ED Triage Vitals [12/06/17 1620]  Enc Vitals Group     BP (!) 154/100     Pulse Rate (!) 101     Resp 18     Temp 98.6 F (37 C)     Temp Source Oral     SpO2 97 %     Weight      Height      Head Circumference      Peak Flow      Pain Score      Pain Loc      Pain Edu?      Excl. in [place]?    No data found.  Updated Vital Signs BP (!) 154/100 (BP Location: Left Arm)   Pulse (!) 101   Temp 98.6 F (37 C) (Oral)   Resp 18   LMP 03/02/2010   SpO2 97%   Visual Acuity Right Eye Distance:   Left  Eye Distance:   Bilateral Distance:    Right Eye Near:  Left Eye Near:    Bilateral Near:     Physical Exam  Constitutional: She is oriented to person, place, and time. She appears well-developed and well-nourished. No distress.  HENT:  Head: Normocephalic and atraumatic.  Right Ear: External ear normal.  Left Ear: External ear normal.  Eyes: Pupils are equal, round, and reactive to light. Conjunctivae and EOM are normal.  Neck: Trachea normal. No JVD present. Muscular tenderness present. No spinous process tenderness present. Carotid bruit is not present. No neck rigidity. Normal range of motion present.    Left superior posterior neck pain as well as lateral neck pain on palpation with specific point tenderness  Cardiovascular: Normal rate, regular rhythm and normal heart sounds.  Hr 97-100 during exam   Pulmonary/Chest: Effort normal and breath sounds normal. No respiratory distress. She exhibits no tenderness.  Musculoskeletal:       Left shoulder: She exhibits normal range of motion, no tenderness, no bony tenderness and no pain.  Neurological: She is alert and oriented to person, place, and time. No cranial nerve deficit or sensory deficit. Coordination normal.  Skin: Skin is warm and dry.     UC Treatments / Results  Labs (all labs ordered are listed, but only abnormal results are displayed) Labs Reviewed - No data to display  EKG None  Radiology No results found.  Procedures Procedures (including critical care time)  Medications Ordered in UC Medications - No data to display  Initial Impression / Assessment and Plan / UC Course  I have reviewed the triage vital signs and the nursing notes.  Pertinent labs & imaging results that were available during my care of the patient were reviewed by me and considered in my medical decision making (see chart for details).     No neurological findings on exam. No previous trauma or injury, woke with pain. Waxes and  wanes, worse with activity. Flexeril does help. No carotid bruit. Point tenderness on exam, reproducible on palpation and with ROM. Remains consistent with muscular pain at this time. Discussed differential of carotid dissection. Return precautions provided. Encouraged close follow up with PCP in the next week for recheck.  Patient verbalized understanding and agreeable to plan.  Ambulatory out of clinic without difficulty.    Case reviewed and discussed with Dr. Meda Coffee, supervising physician.   Final Clinical Impressions(s) / UC Diagnoses   Final diagnoses:  Neck pain     Discharge Instructions     Massage and heat application to affected area.  Meloxicam one a day for 5 days, take with food.  Robaxin as a muscle relaxer. May cause drowsiness. Please do not take if driving or drinking alcohol.  May be most effective at night prior to bed.  Neck exercises as tolerated.    ED Prescriptions    Medication Sig Dispense Auth. Provider   methocarbamol (ROBAXIN) 500 MG tablet Take 1 tablet (500 mg total) by mouth every 8 (eight) hours as needed for up to 14 days for muscle spasms. 42 tablet Augusto Gamble B, NP   meloxicam (MOBIC) 7.5 MG tablet Take 1 tablet (7.5 mg total) by mouth daily for 5 days. 5 tablet Zigmund Gottron, NP     Controlled Substance Prescriptions [place] Controlled Substance Registry consulted? Not Applicable   Zigmund Gottron, NP 12/06/17 1712    Zigmund Gottron, NP 12/06/17 1722

## 2017-12-06 NOTE — ED Triage Notes (Signed)
Pt presents with ongoing stiff, painful neck.  Has been an issue for 3 weeks, muscle relaxers not helping.

## 2017-12-06 NOTE — Discharge Instructions (Addendum)
Massage and heat application to affected area.  Meloxicam one a day for 5 days, take with food.  Robaxin as a muscle relaxer. May cause drowsiness. Please do not take if driving or drinking alcohol.  May be most effective at night prior to bed.  Neck exercises as tolerated.  Please follow up with your primary care provider in the next week for recheck as if no improvement you may need imaging of your neck.  If develop any worsening of pain, vision changes, headache, weakness, numbness, tingling, chest pain, shortness of breath, or any other changes please be seen immediately.

## 2017-12-17 ENCOUNTER — Encounter: Payer: Self-pay | Admitting: Family Medicine

## 2017-12-17 ENCOUNTER — Ambulatory Visit: Payer: BLUE CROSS/BLUE SHIELD | Admitting: Family Medicine

## 2017-12-17 ENCOUNTER — Other Ambulatory Visit: Payer: Self-pay

## 2017-12-17 VITALS — BP 148/90 | HR 87 | Temp 98.3°F | Wt 169.0 lb

## 2017-12-17 DIAGNOSIS — M62838 Other muscle spasm: Secondary | ICD-10-CM

## 2017-12-17 DIAGNOSIS — M436 Torticollis: Secondary | ICD-10-CM | POA: Diagnosis not present

## 2017-12-17 NOTE — Assessment & Plan Note (Signed)
Patient presents with ongoing muscle pain for the last 1 month.  Her symptoms are most consistent with torticollis from a trapezius muscle spasm.  However she has not improved with conservative management at home with home exercises, NSAIDs and muscle relaxers.  Refer to physical therapy.  Encouraged patient to continue using heating pads for symptomatic relief

## 2017-12-17 NOTE — Patient Instructions (Signed)
We will refer you to physical therapy. Please let us know if you do not hear back about schedule after 1 week.  Ibuprofen 600mg  as needed every 6 hours.    Acute Torticollis, Adult Torticollis is a condition in which the muscles of the neck tighten (contract) abnormally, causing the neck to twist and the head to move into an unnatural position. Torticollis that develops suddenly is called acute torticollis. People with acute torticollis may have trouble turning their head. The condition can be painful and may range from mild to severe. What are the causes? This condition may be caused by:  Sleeping in an awkward position (common).  Extending or twisting the neck muscles beyond their normal position.  An injury to the neck muscles.  An infection.  A tumor.  Certain medicines.  Long-lasting spasms of the neck muscles.  In some cases, the cause may not be known. What increases the risk? You are more likely to develop this condition if:  You have a condition associated with loose ligaments, such as Down syndrome.  You have a brain condition that affects vision, such as strabismus.  What are the signs or symptoms? The main symptom of this condition is tilting of the head to one side. Other symptoms include:  Pain in the neck.  Trouble turning the head from side to side or up and down.  How is this diagnosed? This condition may be diagnosed based on:  A physical exam.  Your medical history.  Imaging tests, such as: ? An X-ray. ? An ultrasound. ? A CT scan. ? An MRI.  How is this treated? Treatment for this condition depends on what is causing the condition. Mild cases may go away without treatment. Treatment for more serious cases may include:  Medicines or shots to relax the muscles.  Other medicines, such as antibiotics to treat the underlying cause.  Wearing a soft neck collar.  Physical therapy and stretching to improve neck strength and flexibility.  Neck  massage.  In severe cases, surgery may be needed to repair dislocated or broken bones or to treat nerves in the neck. Follow these instructions at home:  Take over-the-counter and prescription medicines only as told by your health care provider.  Do stretching exercises and massage your neck as told by your health care provider.  If directed, apply heat to the affected area as often as told by your health care provider. Use the heat source that your health care provider recommends, such as a moist heat pack or a heating pad. ? Place a towel between your skin and the heat source. ? Leave the heat on for 20-30 minutes. ? Remove the heat if your skin turns bright red. This is especially important if you are unable to feel pain, heat, or cold. You may have a greater risk of getting burned.  If you wake up with torticollis after sleeping, check your bed or sleeping area. Look for lumpy pillows or unusual objects. Make sure your bed and sleeping area are comfortable.  Keep all follow-up visits as told by your health care provider. This is important. Contact a health care provider if:  You have a fever.  Your symptoms do not improve or they get worse. Get help right away if:  You have trouble breathing.  You develop noisy breathing (stridor).  You start to drool.  You have trouble swallowing or pain when swallowing.  You develop numbness or weakness in your hands or feet.  You have changes  in your speech, understanding, or vision.  You are in severe pain.  You cannot move your head or neck. Summary  Torticollis is a condition in which the muscles of the neck tighten (contract) abnormally, causing the neck to twist and the head to move into an unnatural position. Torticollis that develops suddenly is called acute torticollis.  Treatment for this condition depends on what is causing the condition. Mild cases may go away without treatment.  Do stretching exercises and massage your  neck as told by your health care provider. You may also be instructed to apply heat to the area.  Contact your health care provider if your symptoms do not improve or they get worse. This information is not intended to replace advice given to you by your health care provider. Make sure you discuss any questions you have with your health care provider. Document Released: 03/10/2000 Document Revised: 05/11/2016 Document Reviewed: 05/11/2016 Elsevier Interactive Patient Education  [employer].

## 2017-12-17 NOTE — Progress Notes (Signed)
    Subjective:  Krista Ellison is a 62 y.o. female who presents to the [facility] today with a chief complaint of neck pain.   HPI:  Patient states that she has had L sided neck pain for the last 1 month. No trauma or usual physical activity.  She thinks she might have slept in a weird position the night before she woke up with this.  She has this neck pain nearly every day.  Some days are better than others.  It feels like a tightness or spasm that is all along the left posterior part of her neck and kind of goes up to the base of her skull.   She was seen for this here on 11/22/17 and again was reevaluated at urgent care on 12/06/17. She has tried meloxicam and methocarbamol as well as home exercises without much relief.  ROS: Per HPI   Objective:  Physical Exam: BP (!) 148/90   Pulse 87   Temp 98.3 F (36.8 C) (Oral)   Wt 169 lb (76.7 kg)   LMP 03/02/2010   SpO2 98%   BMI 27.28 kg/m   Gen: NAD, resting comfortably Pulm: NWOB on room air MSK: Neck midline. tender palpation over left trapezius muscle.  Reduced range of motion with neck rotation to the left and left neck sidebending.  No erythema or bogginess.  Skin: warm, dry Psych: Normal affect and thought content   Assessment/Plan:  Neck muscle spasm Patient presents with ongoing muscle pain for the last 1 month.  Her symptoms are most consistent with torticollis from a trapezius muscle spasm.  However she has not improved with conservative management at home with home exercises, NSAIDs and muscle relaxers.  Refer to physical therapy.  Encouraged patient to continue using heating pads for symptomatic relief   Bufford Lope, DO PGY-3, [facility] 12/17/2017 4:18 PM

## 2018-01-31 ENCOUNTER — Other Ambulatory Visit: Payer: Self-pay | Admitting: *Deleted

## 2018-01-31 DIAGNOSIS — I10 Essential (primary) hypertension: Secondary | ICD-10-CM

## 2018-01-31 NOTE — Telephone Encounter (Signed)
Pt left message on nurse line.  She needs a refill on her BP meds.  I do not see one on her med list. Will forward to MD. Clinton Sawyer, Salome Spotted, [place]

## 2018-02-01 ENCOUNTER — Other Ambulatory Visit: Payer: Self-pay | Admitting: Family Medicine

## 2018-02-01 DIAGNOSIS — I10 Essential (primary) hypertension: Secondary | ICD-10-CM

## 2018-02-01 MED ORDER — CHLORTHALIDONE 25 MG PO TABS: 25.0000 mg | ORAL_TABLET | Freq: Every day | ORAL | 0 refills | Status: DC

## 2018-02-01 NOTE — Telephone Encounter (Signed)
To blue team. 

## 2018-02-01 NOTE — Telephone Encounter (Signed)
Already refilled in separate encounter. Also patient needs BMP and appt for HTN as recent BPs in office have been elevated - see documentation in separate patient calls encounter.

## 2018-02-01 NOTE — Telephone Encounter (Signed)
Attempted to call patient to schedule with PCP. LVM asking to call us back to schedule an apt with Shawna Orleans.

## 2018-02-01 NOTE — Telephone Encounter (Signed)
Needs HTN follow up and a BMP. Placed as future order. Can patient please be informed that she needs to be seen. She can either make 2 appts: one with me and one with lab; or she can wait and do both on the same day.

## 2018-02-11 MED ORDER — CHLORTHALIDONE 25 MG PO TABS
25.0000 mg | ORAL_TABLET | Freq: Every day | ORAL | 0 refills | Status: AC
Start: 2018-02-11 — End: 2018-03-13

## 2018-02-11 NOTE — Addendum Note (Signed)
Addended by: Christen Bame D on: 02/11/2018 01:50 PM   Modules accepted: Orders

## 2018-02-11 NOTE — Telephone Encounter (Signed)
Pts insurance only covers a 90 day supply.  Resent one 3 month supply and asked pt to make appt. Kisa Fujii, Salome Spotted, CMA

## 2018-03-08 ENCOUNTER — Ambulatory Visit (INDEPENDENT_AMBULATORY_CARE_PROVIDER_SITE_OTHER): Payer: BLUE CROSS/BLUE SHIELD | Admitting: Family Medicine

## 2018-03-08 ENCOUNTER — Encounter: Payer: Self-pay | Admitting: Family Medicine

## 2018-03-08 VITALS — BP 150/90 | HR 97 | Temp 98.6°F | Ht 66.0 in | Wt 169.2 lb

## 2018-03-08 DIAGNOSIS — Z23 Encounter for immunization: Secondary | ICD-10-CM

## 2018-03-08 DIAGNOSIS — I10 Essential (primary) hypertension: Secondary | ICD-10-CM

## 2018-03-08 MED ORDER — METOPROLOL SUCCINATE ER 25 MG PO TB24
25.0000 mg | ORAL_TABLET | Freq: Every day | ORAL | 0 refills | Status: DC
Start: 2018-03-08 — End: 2018-06-05

## 2018-03-08 NOTE — Patient Instructions (Addendum)
Add metoprolol and check your blood pressures for the next few weeks and let me know your home readings.   Call me 3 weeks.   Come back for your pap smear.

## 2018-03-08 NOTE — Assessment & Plan Note (Signed)
Uncontrolled.  Continue chlorthalidone 25 mg daily.  Add metoprolol 25 mg daily.  Given her financial difficulties with affording her co-pays discussed that patient should call me in 3 weeks with home blood pressure readings.  No need to recheck labs as therapy oval in care everywhere.

## 2018-03-08 NOTE — Progress Notes (Signed)
    Subjective:  Krista Ellison is a 62 y.o. female who presents to the [facility] today for HTN follow up  HPI:  Subjective:  Krista Ellison is a 62 y.o. female with hypertension. Taking chlorthalidone 25 mg daily, tolerating well, compliant ROS: taking medications as instructed, no medication side effects noted, patient does not perform home BP monitoring, no TIA's, no chest pain on exertion, no dyspnea on exertion, noting swelling of ankles and no orthostatic dizziness or lightheadedness.  She is unfortunately unable to tolerate lisinopril due to hyperkalemia and moderate pain due to leg swelling. Has chronic leg swelling from bad circulation and wears compression stockings every day She is having some financial difficulty. She has been getting her labs checked at [facility] most recently in September of this year. She follows a low-salt healthy diet with lots of vegetables.  She does not exercise regularly with the change in weather.  ROS: Per HPI   Objective:  Physical Exam: BP (!) 150/90 Comment: 2nd attempt 154/90  Pulse 97   Temp 98.6 F (37 C) (Oral)   Ht 5\' 6"  (1.676 m)   Wt 169 lb 4 oz (76.8 kg)   LMP 03/02/2010   SpO2 97%   BMI 27.32 kg/m   Gen: NAD, resting comfortably CV: RRR with no murmurs appreciated Pulm: NWOB, CTAB with no crackles, wheezes, or rhonchi GI: Normal bowel sounds present. Soft, Nontender, Nondistended. MSK: 1+ pitting edema in LE with compression stockings in place Skin: warm, dry Neuro: grossly normal, moves all extremities Psych: Normal affect and thought content    Assessment/Plan:  Essential hypertension Uncontrolled.  Continue chlorthalidone 25 mg daily.  Add metoprolol 25 mg daily.  Given her financial difficulties with affording her co-pays discussed that patient should call me in 3 weeks with home blood pressure readings.  No need to recheck labs as therapy oval in care everywhere.  Healthcare maintenance Patient declined HIV,  hep C, Pap, colon cancer screening today.  She will come back and reschedule for a Pap smear later in the spring at her convenience.  Bufford Lope, DO PGY-3, [facility] 03/08/2018 3:40 PM

## 2018-04-19 DIAGNOSIS — H04123 Dry eye syndrome of bilateral lacrimal glands: Secondary | ICD-10-CM | POA: Diagnosis not present

## 2018-04-19 DIAGNOSIS — H2513 Age-related nuclear cataract, bilateral: Secondary | ICD-10-CM | POA: Diagnosis not present

## 2018-04-19 DIAGNOSIS — H5213 Myopia, bilateral: Secondary | ICD-10-CM | POA: Diagnosis not present

## 2018-04-23 ENCOUNTER — Telehealth: Payer: Self-pay

## 2018-04-23 NOTE — Telephone Encounter (Signed)
Patient called to report BP is still elevated but better than before. Has taken once weekly x 3 weeks with following readings:  142/88, 152/86, 158/86  Call back is [phone]  Danley Danker, RN [facility] RN)

## 2018-04-24 NOTE — Telephone Encounter (Signed)
LVM for pt to give [place] a call back to scheduled an appt with Dr. Shawna Orleans. About considering additional BP medication. Salvatore Marvel, CMA

## 2018-04-24 NOTE — Telephone Encounter (Signed)
Left message for patient that may need to consider additional BP medication. If she is willing to come in for appointment to discuss can she please be scheduled.

## 2018-05-09 DIAGNOSIS — D485 Neoplasm of uncertain behavior of skin: Secondary | ICD-10-CM | POA: Diagnosis not present

## 2018-05-09 DIAGNOSIS — L820 Inflamed seborrheic keratosis: Secondary | ICD-10-CM | POA: Diagnosis not present

## 2018-05-09 DIAGNOSIS — D045 Carcinoma in situ of skin of trunk: Secondary | ICD-10-CM | POA: Diagnosis not present

## 2018-05-09 DIAGNOSIS — D2270 Melanocytic nevi of unspecified lower limb, including hip: Secondary | ICD-10-CM | POA: Diagnosis not present

## 2018-05-17 DIAGNOSIS — H04123 Dry eye syndrome of bilateral lacrimal glands: Secondary | ICD-10-CM | POA: Diagnosis not present

## 2018-05-17 DIAGNOSIS — H2513 Age-related nuclear cataract, bilateral: Secondary | ICD-10-CM | POA: Diagnosis not present

## 2018-05-29 ENCOUNTER — Ambulatory Visit: Payer: BLUE CROSS/BLUE SHIELD | Admitting: Family Medicine

## 2018-05-29 DIAGNOSIS — Z5181 Encounter for therapeutic drug level monitoring: Secondary | ICD-10-CM | POA: Diagnosis not present

## 2018-05-29 DIAGNOSIS — T86818 Other complications of lung transplant: Secondary | ICD-10-CM | POA: Diagnosis not present

## 2018-05-29 DIAGNOSIS — Z79899 Other long term (current) drug therapy: Secondary | ICD-10-CM | POA: Diagnosis not present

## 2018-05-29 DIAGNOSIS — Z942 Lung transplant status: Secondary | ICD-10-CM | POA: Diagnosis not present

## 2018-06-04 ENCOUNTER — Encounter: Payer: Self-pay | Admitting: Family Medicine

## 2018-06-04 ENCOUNTER — Ambulatory Visit: Payer: BLUE CROSS/BLUE SHIELD | Admitting: Family Medicine

## 2018-06-04 ENCOUNTER — Other Ambulatory Visit: Payer: Self-pay

## 2018-06-04 VITALS — BP 140/54 | HR 66 | Temp 98.4°F | Wt 175.2 lb

## 2018-06-04 DIAGNOSIS — I10 Essential (primary) hypertension: Secondary | ICD-10-CM

## 2018-06-04 NOTE — Patient Instructions (Signed)
No changes to blood pressure medication today.  Let me know if persistently elevated in case we need to add additional medication.  Keep up the good work with walking.

## 2018-06-04 NOTE — Assessment & Plan Note (Signed)
At goal today. Discussed benefits of taking antihypertensives at night, patient will resume. Joint decision making with patient today to continue monitoring at home and call back if elevated. If needs additional agent may consider spironolactone with close monitoring of K since she has been unable to tolerate norvasc or ACEi in the past.

## 2018-06-04 NOTE — Progress Notes (Signed)
    Subjective:  Krista Ellison is a 63 y.o. female who presents to the [facility] today for hypertension follow up  HPI:  Changed her BP medications to the morning from night o see if iti would make a difference. Was at a different doctor's appointment at [facility] last week and her blood pressure was elevated there to SBP 170s  She has started walking more regularly which she thinks may be helping. No CP, SOB, lightheadedness, dizziness, vision changes. Her legs are chronically swollen without acute worsening, wears compression hose regularly which helps. No claudication   ROS: Per HPI   Objective:  Physical Exam: BP (!) 140/54   Pulse 66   Temp 98.4 F (36.9 C) (Oral)   Wt 175 lb 4 oz (79.5 kg)   LMP 03/02/2010   SpO2 96%   BMI 28.29 kg/m   Gen: NAD, resting comfortably CV: RRR with no murmurs appreciated Pulm: NWOB, CTAB with no crackles, wheezes, or rhonchi GI: Normal bowel sounds present. Soft, Nontender, Nondistended. Skin: warm, dry Neuro: grossly normal, moves all extremities Psych: Normal affect and thought content  Assessment/Plan:  Essential hypertension At goal today. Discussed benefits of taking antihypertensives at night, patient will resume. Joint decision making with patient today to continue monitoring at home and call back if elevated. If needs additional agent may consider spironolactone with close monitoring of K since she has been unable to tolerate norvasc or ACEi in the past.  Health maintenance Declined pap and colon CA screening today.  Bufford Lope, DO PGY-3, [facility] 06/04/2018 3:41 PM

## 2018-06-05 ENCOUNTER — Other Ambulatory Visit: Payer: Self-pay | Admitting: Family Medicine

## 2018-06-05 DIAGNOSIS — I10 Essential (primary) hypertension: Secondary | ICD-10-CM

## 2019-07-24 ENCOUNTER — Emergency Department (HOSPITAL_COMMUNITY): Payer: Worker's Compensation

## 2019-07-24 ENCOUNTER — Encounter (HOSPITAL_COMMUNITY): Payer: Self-pay | Admitting: Emergency Medicine

## 2019-07-24 ENCOUNTER — Emergency Department (HOSPITAL_COMMUNITY)
Admission: EM | Admit: 2019-07-24 | Discharge: 2019-07-24 | Disposition: A | Discharge status: Home / Self Care | Payer: Worker's Compensation | Attending: Emergency Medicine | Admitting: Emergency Medicine

## 2019-07-24 ENCOUNTER — Other Ambulatory Visit: Payer: Self-pay

## 2019-07-24 DIAGNOSIS — Z87891 Personal history of nicotine dependence: Secondary | ICD-10-CM | POA: Diagnosis not present

## 2019-07-24 DIAGNOSIS — J449 Chronic obstructive pulmonary disease, unspecified: Secondary | ICD-10-CM | POA: Diagnosis not present

## 2019-07-24 DIAGNOSIS — S6991XA Unspecified injury of right wrist, hand and finger(s), initial encounter: Secondary | ICD-10-CM | POA: Diagnosis present

## 2019-07-24 DIAGNOSIS — Y939 Activity, unspecified: Secondary | ICD-10-CM | POA: Insufficient documentation

## 2019-07-24 DIAGNOSIS — S60221A Contusion of right hand, initial encounter: Secondary | ICD-10-CM | POA: Insufficient documentation

## 2019-07-24 DIAGNOSIS — Y990 Civilian activity done for income or pay: Secondary | ICD-10-CM | POA: Diagnosis not present

## 2019-07-24 DIAGNOSIS — Y92120 Kitchen in nursing home as the place of occurrence of the external cause: Secondary | ICD-10-CM | POA: Insufficient documentation

## 2019-07-24 DIAGNOSIS — W010XXA Fall on same level from slipping, tripping and stumbling without subsequent striking against object, initial encounter: Secondary | ICD-10-CM | POA: Insufficient documentation

## 2019-07-24 DIAGNOSIS — Z79899 Other long term (current) drug therapy: Secondary | ICD-10-CM | POA: Insufficient documentation

## 2019-07-24 DIAGNOSIS — I10 Essential (primary) hypertension: Secondary | ICD-10-CM | POA: Diagnosis not present

## 2019-07-24 DIAGNOSIS — W19XXXA Unspecified fall, initial encounter: Secondary | ICD-10-CM

## 2019-07-24 NOTE — ED Provider Notes (Signed)
[place] DEPT Provider Note   CSN: [id number] Arrival date & time: 07/24/19  [id number]     History Chief Complaint  Patient presents with  . Fall  . Hand Injury    Krista Ellison is a 64 y.o. female with no relevant past medical history presents to the ED after sustaining mechanical fall while at work.  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  She was in the kitchen when she tripped over boxes that "were not supposed to be there".  She fell forward and landed on an outstretched right hand.  She complains of 5 out of 10 discomfort to the fourth and fifth metacarpals of her right hand.  She also landed on her face and has a swollen upper lip.  No active bleeding.  No dental pain.  She denies any presyncopal prodrome, LOC, memory impairment, chest discomfort or difficulty breathing, numbness or weakness, or other symptoms.  HPI     Past Medical History:  Diagnosis Date  . Alpha-1-antitrypsin deficiency ([place])    s/p double lung transplant  . Arthritis   . Bronchitis    hx of  . Cancer ([place])    skin x 2  . COPD (chronic obstructive pulmonary disease) ([facility])    hx of. Had lung transplant  . Dizziness   . Hyperlipidemia    takes Pravastatin  . Hypertension    Takes Lisinopril  . Left radial fracture   . Pneumonia    hx of    Patient Active Problem List   Diagnosis Date Noted  . Neck muscle spasm 11/22/2017  . Emphysema due to alpha-1-antitrypsin deficiency ([place]) 08/03/2017  . Varicose vein of leg 08/03/2017  . Melanoma ([place]) 08/03/2017  . Renal insufficiency 08/03/2017  . Prediabetes 08/03/2017  . Cataract 08/03/2017  . History of nonmelanoma skin cancer 08/10/2011  . Osteoporosis/osteopenia increased risk 07/04/2011  . Essential hypertension 07/04/2011  . Hypercholesterolemia 07/04/2011  . Immunosuppression ([place]) 06/14/2011  . Lung transplant status, bilateral ([place])  06/22/2000    Past Surgical History:  Procedure Laterality Date  . BREAST EXCISIONAL BIOPSY Right   . [place]   Clean cath  . DILATION AND CURETTAGE OF UTERUS    . LUNG TRANSPLANT, DOUBLE  2002  . WRIST SURGERY  05/2011   radial fx     OB History   No obstetric history on file.     Family History  Problem Relation Age of Onset  . Diabetes Father   . Hypertension Father   . Asthma Father   . Heart disease Father   . Hyperlipidemia Father   . High blood pressure Father   . Kidney disease Father        was on dialysis  . Thyroid disease Mother   . Cancer Mother        liver, hodgkins, skin  . Thyroid disease Sister        after radiation for hodgkins  . Breast cancer Sister   . Anesthesia problems Neg Hx   . Hypotension Neg Hx   . Pseudochol deficiency Neg Hx   . Malignant hyperthermia Neg Hx     Social History   Tobacco Use  . Smoking status: Former Smoker    Packs/day: 1.50    Years: 17.00    Pack years: 25.50    Types: Cigarettes    Quit date: 1999    Years since quitting: 22.3  .  Smokeless tobacco: Never Used  Substance Use Topics  . Alcohol use: Yes    Alcohol/week: 7.0 standard drinks    Types: 7 Standard drinks or equivalent per week  . Drug use: No    Comment: remotely in past 1990s marijuana and cocaine, none since.    Home Medications Prior to Admission medications   Medication Sig Start Date End Date Taking? Authorizing Provider  acyclovir (ZOVIRAX) 400 MG tablet Take 400 mg by mouth 2 (two) times daily.     [provider]  chlorthalidone (HYGROTON) 25 MG tablet Take 1 tablet (25 mg total) by mouth daily. 02/11/18 03/13/18  Bufford Lope, DO  cholecalciferol (VITAMIN D) 1000 UNITS tablet Take 2,000 Units by mouth daily.    [provider]  cycloSPORINE modified (NEORAL) 100 MG capsule Take 125 mg by mouth 2 (two) times daily.    [provider]  fish oil-omega-3 fatty acids 1000 MG capsule Take  2 g by mouth daily.    [provider]  metoprolol succinate (TOPROL-XL) 25 MG 24 hr tablet TAKE 1 TABLET BY MOUTH EVERY DAY 06/05/18   Bufford Lope, DO  mycophenolate (CELLCEPT) 500 MG tablet Take 500 mg by mouth 2 (two) times daily.    [provider]  pravastatin (PRAVACHOL) 10 MG tablet Take 10 mg by mouth daily.    [provider]  predniSONE (DELTASONE) 5 MG tablet Take 5 mg by mouth daily.    [provider]  Probiotic Product (PROBIOTIC DAILY PO) Take 1 capsule by mouth daily.    [provider]  sulfamethoxazole-trimethoprim (BACTRIM,SEPTRA) 400-80 MG per tablet Take 1 tablet by mouth every Monday, Wednesday, and Friday.    [provider]    Allergies    Amlodipine, Lisinopril, and Rapamune [sirolimus]  Review of Systems   Review of Systems  Musculoskeletal: Positive for myalgias.  Skin: Positive for color change. Negative for wound.  Neurological: Negative for weakness and numbness.  Hematological: Does not bruise/bleed easily.    Physical Exam Updated Vital Signs BP (!) 186/96 (BP Location: Left Arm)   Pulse 67   Temp 98.3 F (36.8 C) (Oral)   Resp 18   Ht 5\' 6"  (1.676 m)   Wt 74.8 kg   LMP 03/02/2010   SpO2 98%   BMI 26.63 kg/m   Physical Exam Vitals and nursing note reviewed. Exam conducted with a chaperone present.  Constitutional:      General: She is not in acute distress.    Appearance: Normal appearance.  HENT:     Head: Normocephalic.     Comments: Swollen, bruised upper lip.  No open wound.  No bleeding.  No dental tenderness or obvious dental injury.  No tongue biting.    Mouth/Throat:     Comments: Patent oropharynx.  No tongue swelling.  Partner secretions well.  No trismus. Eyes:     General: No scleral icterus.    Conjunctiva/sclera: Conjunctivae normal.  Cardiovascular:     Rate and Rhythm: Normal rate and regular rhythm.     Pulses: Normal pulses.     Heart sounds: Normal heart sounds.    Pulmonary:     Effort: Pulmonary effort is normal.  Musculoskeletal:     Cervical back: Normal range of motion.     Comments: Right hand: Tenderness along fourth and fifth metacarpals with associated ecchymoses and mild swelling.  Capillary refill and radial pulse intact.  Sensation intact throughout.  Able to flex and extend phalanges.  She is limited in her ability to make a closed fist due to her symptoms of discomfort.  No weakness.  Compartments soft.  No open wound. Right wrist: Can flex and extend against resistance.  No bony TTP.  No swelling or overlying skin changes. Right elbow: Normal.  Skin:    General: Skin is dry.     Capillary Refill: Capillary refill takes less than 2 seconds.  Neurological:     Mental Status: She is alert.     GCS: GCS eye subscore is 4. GCS verbal subscore is 5. GCS motor subscore is 6.  Psychiatric:        Mood and Affect: Mood normal.        Behavior: Behavior normal.        Thought Content: Thought content normal.     ED Results / Procedures / Treatments   Labs (all labs ordered are listed, but only abnormal results are displayed) Labs Reviewed - No data to display  EKG None  Radiology DG Hand Complete Right  Result Date: 07/24/2019 CLINICAL DATA:  Fall, and pain EXAM: RIGHT HAND - COMPLETE 3+ VIEW COMPARISON:  None. FINDINGS: No acute bony abnormality. Specifically, no fracture, subluxation, or dislocation. Soft tissues are intact. IMPRESSION: No acute bony abnormality. Electronically Signed   By: Rolm Baptise M.D.   On: 07/24/2019 10:30    Procedures Procedures (including critical care time)  Medications Ordered in ED Medications - No data to display  ED Course  I have reviewed the triage vital signs and the nursing notes.  Pertinent labs & imaging results that were available during my care of the patient were reviewed by me and considered in my medical decision making (see chart for details).    MDM Rules/Calculators/A&P                       I reviewed plain films obtained of right hand which demonstrate no evidence of fracture, subluxation, effusion, or other acute osseous abnormalities.  Patient is able to demonstrate ROM of all phalanges, albeit with some discomfort.  She denies any weakness or sensation loss.  Informed patient that there is always a possibility that fractures or other acute osseous other abnormalities are missed on x-ray.  As for her mildly swollen and bruised upper lip, there is no obvious dental injury and she denies any significant discomfort.  She also denies any neck pain or headache.  Do not feel as though imaging is warranted.  No midline cervical tenderness.  She is able to demonstrate full ROM of neck.    We will place patient in a protective brace and refer her to her primary care provider for ongoing evaluation and management.  We will also refer her to hand surgery should her symptoms fail to improve with conservative therapy.  Her blood pressure was elevated in the ED here today, but likely attributed to her symptoms of discomfort.    Strict ED return precautions discussed with the patient.  All of the evaluation and work-up results were discussed with the patient and any family at bedside. They were provided opportunity to ask any additional questions and have none at this time. They have expressed understanding of verbal discharge instructions as well as return precautions and are agreeable to the plan.    Final Clinical Impression(s) / ED Diagnoses Final diagnoses:  Fall, initial encounter  Contusion of right hand, initial encounter    Rx / DC Orders ED Discharge Orders  None       Reita Chard 07/24/19 1149    Dorie Rank, MD 07/25/19 [id number]

## 2019-07-24 NOTE — ED Triage Notes (Signed)
Patient was at work and tripped over some boxes, fell face-forward and hit her face, tried to catch herself with her hands, R hand pain with movement now. Top medial lip is busted, bleeding controlled.

## 2019-07-24 NOTE — ED Notes (Signed)
Pt verbalizes understanding of DC instructions. Pt belongings returned and is ambulatory out of ED.  

## 2019-07-24 NOTE — Discharge Instructions (Signed)
Please notify your primary care provider regarding today's encounter.  I would also like you to follow-up with hand surgery, Dr. Fredna Dow, if symptoms fail to improve with conservative therapy.  Please read the attachments on hand contusion and RICE therapy.  Tylenol as needed for symptoms of discomfort.  Keep the affected hand elevated.  Return to the ED or seek immediate medical attention for any new or worsening symptoms.

## 2019-07-24 NOTE — Progress Notes (Signed)
Orthopedic Tech Progress Note Patient Details:  Krista Ellison [birth year] [id number]  Ortho Devices Type of Ortho Device: Velcro wrist splint Ortho Device/Splint Location: RUE Ortho Device/Splint Interventions: Ordered, Application, Adjustment   Post Interventions Patient Tolerated: Well Instructions Provided: Care of device, Adjustment of device   Krista Ellison [name] 07/24/2019, 11:55 AM

## 2019-10-29 ENCOUNTER — Ambulatory Visit: Payer: Self-pay | Admitting: Family Medicine

## 2020-01-04 IMAGING — MG DIGITAL SCREENING BILATERAL MAMMOGRAM WITH TOMO AND CAD
8 series · 8 of 24 positions shown · non-contrast
Comparison: Previous exam(s).

CLINICAL DATA: Screening.

EXAM:
DIGITAL SCREENING BILATERAL MAMMOGRAM WITH TOMO AND CAD

[L CC synth-2D]
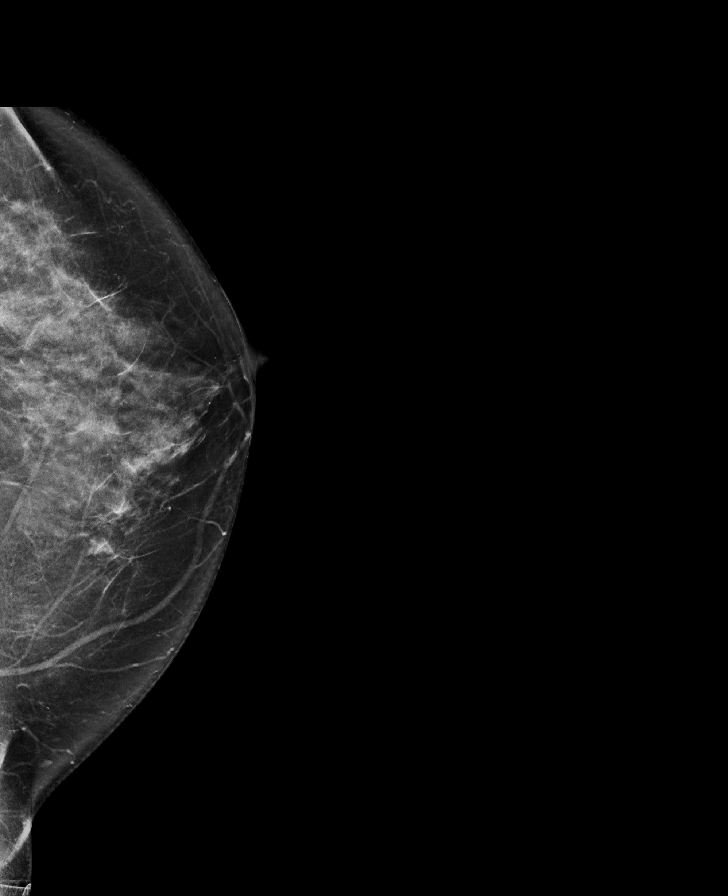

[R MLO synth-2D]
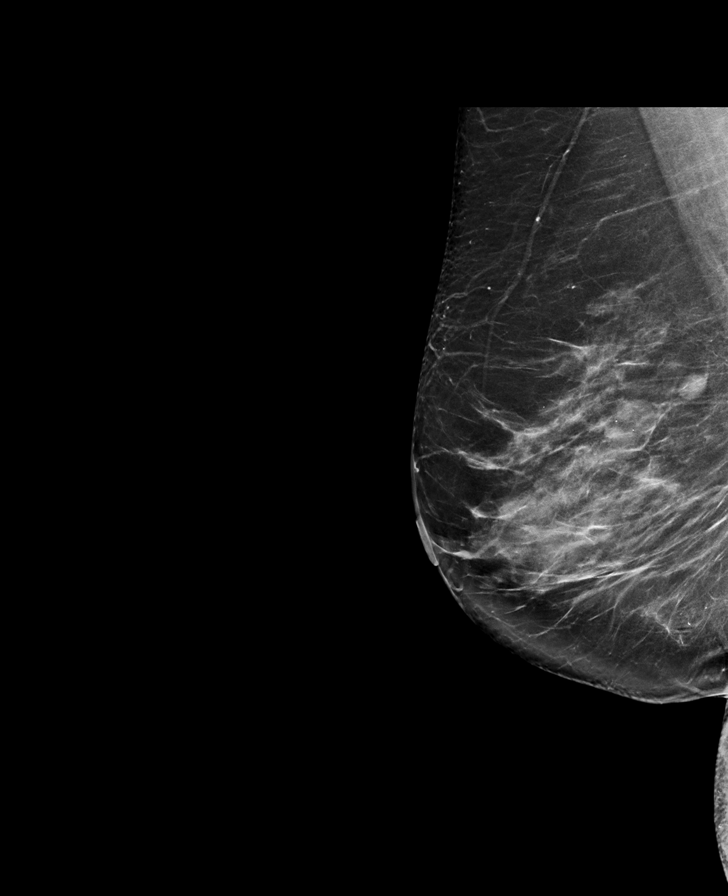

[R CC synth-2D]
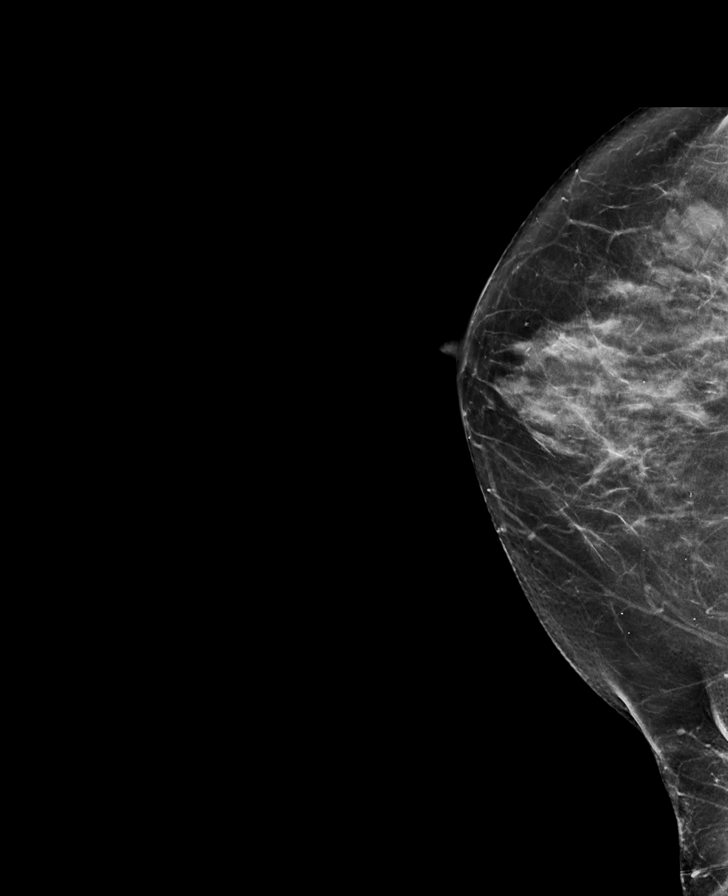

[L MLO synth-2D]
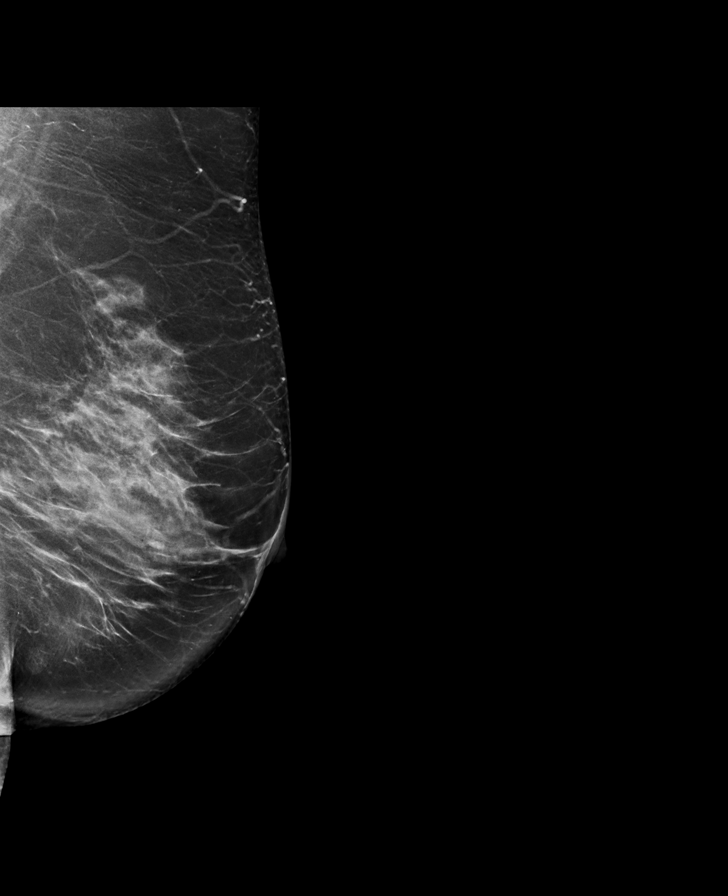

[R CC tomo · tomo slice 37/72.0]
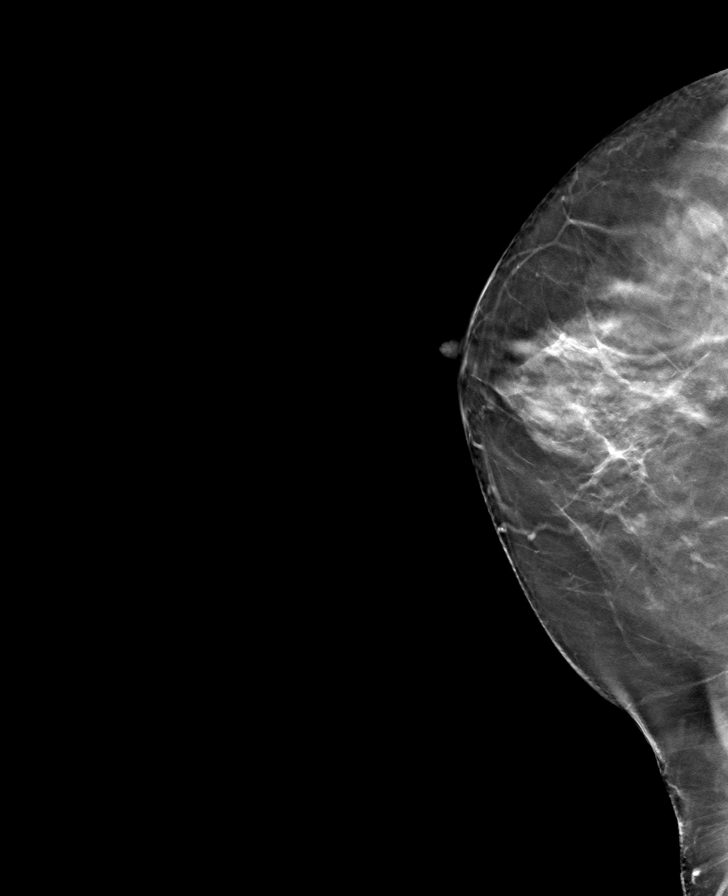

[L MLO tomo · tomo slice 41/82.0]
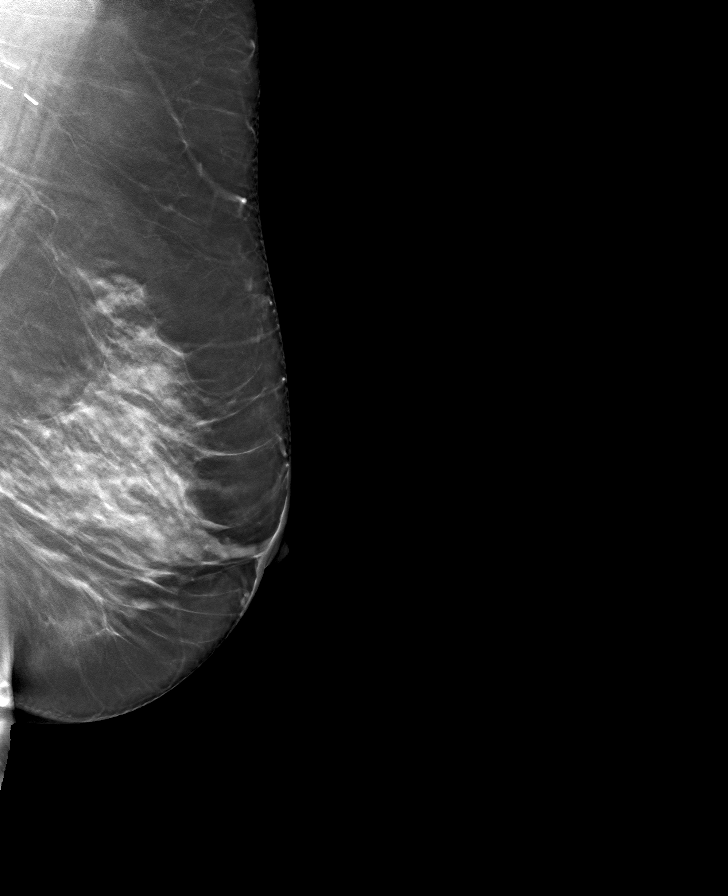

[L CC tomo · tomo slice 38/75.0]
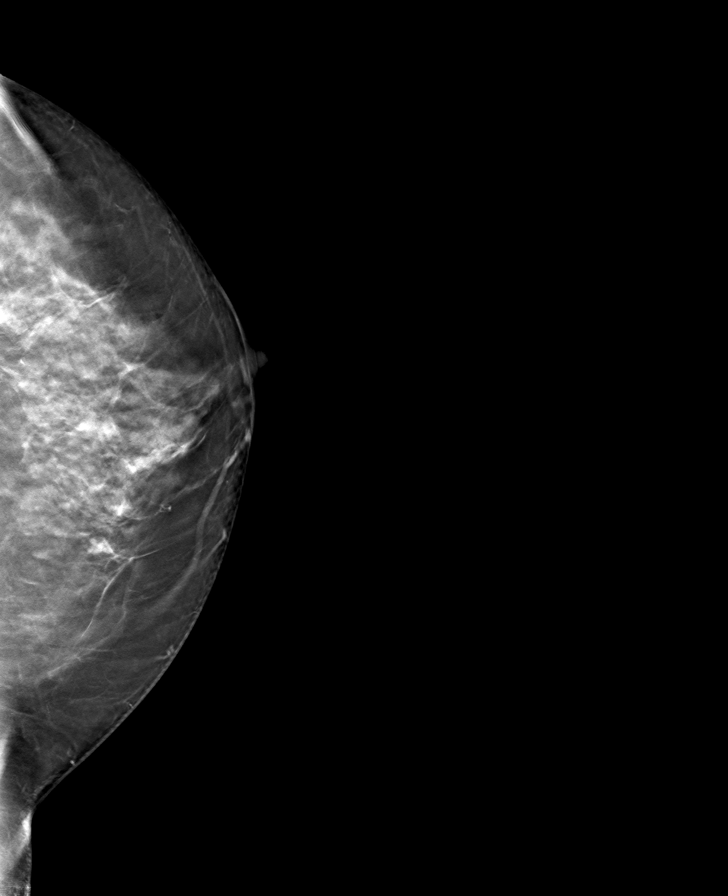

[R MLO tomo · tomo slice 41/82.0]
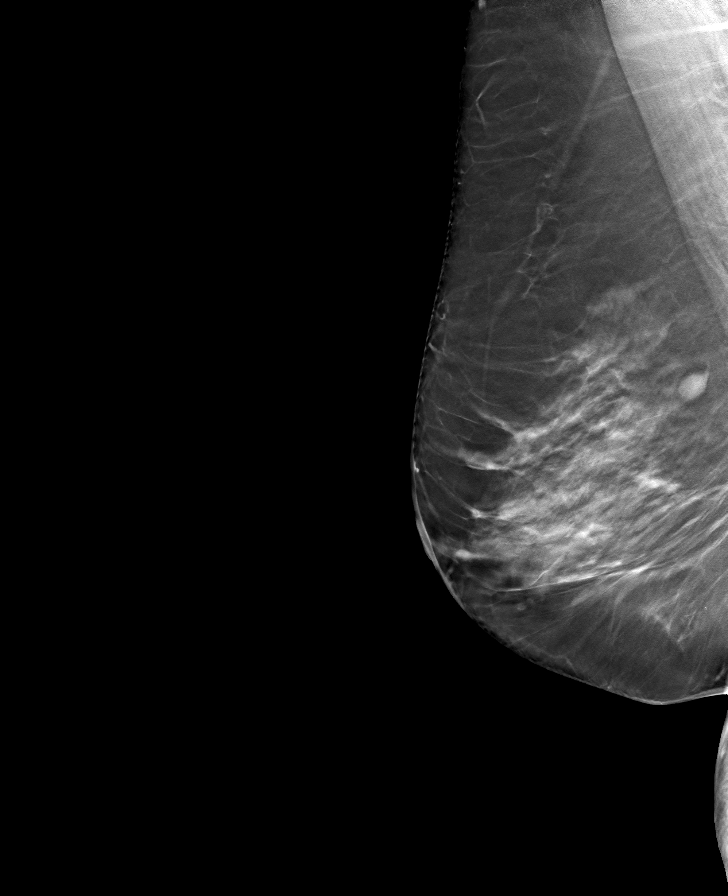

[8 of 24 positions shown; findings below may reference images not displayed]

ACR Breast Density Category c: The breast tissue is heterogeneously
dense, which may obscure small masses.
FINDINGS: There are no findings suspicious for malignancy. Images were
processed with CAD.
IMPRESSION: No mammographic evidence of malignancy. A result letter of this
screening mammogram will be mailed directly to the patient.

RECOMMENDATION:
Screening mammogram in one year. (Code:[id number]-U-LHB)

BI-RADS CATEGORY  1: Negative.

## 2020-11-12 ENCOUNTER — Other Ambulatory Visit: Payer: Self-pay

## 2020-11-12 DIAGNOSIS — I8393 Asymptomatic varicose veins of bilateral lower extremities: Secondary | ICD-10-CM

## 2020-11-24 ENCOUNTER — Encounter: Payer: Self-pay | Admitting: Physician Assistant

## 2020-11-24 ENCOUNTER — Other Ambulatory Visit: Payer: Self-pay

## 2020-11-24 ENCOUNTER — Ambulatory Visit (HOSPITAL_COMMUNITY)
Admission: RE | Admit: 2020-11-24 | Discharge: 2020-11-24 | Disposition: A | Discharge status: Home / Self Care | Payer: PRIVATE HEALTH INSURANCE | Source: Ambulatory Visit | Attending: Vascular Surgery | Admitting: Vascular Surgery

## 2020-11-24 ENCOUNTER — Ambulatory Visit (INDEPENDENT_AMBULATORY_CARE_PROVIDER_SITE_OTHER): Payer: PRIVATE HEALTH INSURANCE | Admitting: Physician Assistant

## 2020-11-24 VITALS — BP 156/91 | HR 66 | Temp 98.2°F | Resp 20 | Ht 66.0 in | Wt 177.9 lb

## 2020-11-24 DIAGNOSIS — I872 Venous insufficiency (chronic) (peripheral): Secondary | ICD-10-CM | POA: Diagnosis not present

## 2020-11-24 DIAGNOSIS — I8393 Asymptomatic varicose veins of bilateral lower extremities: Secondary | ICD-10-CM | POA: Diagnosis present

## 2020-11-24 NOTE — Progress Notes (Signed)
Requested by:  Gladys Damme, MD [phone],  [place] [id number]  Reason for consultation: bilateral pain and swelling   History of Present Illness   Krista Ellison is a 65 y.o. (1955-04-13) female who presents for evaluation of bilateral leg swelling and pain. 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 She has noticed more recently that her legs are feeling more weak by the end of the day. She says use to love to walk and hike outside of work and now she cant really enjoy those things because her legs bother her too much. She has mild swelling that is worse at end of the day as well as heaviness, tiredness, aching, burning and itching. She wears support hose to work but feels her legs still feel bad at end of the day. She does not really elevate her legs but she does feel better when she props them up or when she has days off work. She does  have family history of venous disease in her mother. She has no history of DVT or family history.   Venous symptoms include: aching, heavy, tired, throbbing, burning, itching, swelling Onset/duration: > 10 years Occupation:  cook Aggravating factors: standing Alleviating factors: rest, elevation Compression:  yes Helps:  yes Pain medications:  none Previous vein procedures:  none History of DVT:  none  Past Medical History:  Diagnosis Date   Alpha-1-antitrypsin deficiency ([place])    s/p double lung transplant   Arthritis    Bronchitis    hx of   Cancer ([place])    skin x 2   COPD (chronic obstructive pulmonary disease) ([facility])    hx of. Had lung transplant   Dizziness    Hyperlipidemia    takes Pravastatin   Hypertension    Takes Lisinopril   Left radial fracture    Pneumonia    hx of    Past Surgical History:  Procedure Laterality Date   BREAST EXCISIONAL BIOPSY Right    CARDIAC CATHETERIZATION  2000   Clean cath   DILATION AND CURETTAGE  OF UTERUS     LUNG TRANSPLANT, DOUBLE  2002   WRIST SURGERY  05/2011   radial fx    Social History   Socioeconomic History   Marital status: Single    Spouse name: Not on file   Number of children: Not on file   Years of education: Not on file   Highest education level: Not on file  Occupational History   Not on file  Tobacco Use   Smoking status: Former    Packs/day: 1.50    Years: 17.00    Pack years: 25.50    Types: Cigarettes    Quit date: 1999    Years since quitting: 23.6   Smokeless tobacco: Never  Vaping Use   Vaping Use: Never used  Substance and Sexual Activity   Alcohol use: Yes    Alcohol/week: 7.0 standard drinks    Types: 7 Standard drinks or equivalent per week   Drug use: No    Comment: remotely in past 1990s marijuana and cocaine, none since.   Sexual activity: Not Currently  Other Topics Concern   Not on file  Social History Narrative   Moved from [place] after divorce in 11/2010 to be closer to her son and grandson in [place]. Works in Ambulance person at [employer]. Former smoker  x 17 years, quit in 1999.    Likes to hike and walk.    Social Determinants of Health   Financial Resource Strain: Not on file  Food Insecurity: Not on file  Transportation Needs: Not on file  Physical Activity: Not on file  Stress: Not on file  Social Connections: Not on file  Intimate Partner Violence: Not on file    Family History  Problem Relation Age of Onset   Diabetes Father    Hypertension Father    Asthma Father    Heart disease Father    Hyperlipidemia Father    High blood pressure Father    Kidney disease Father        was on dialysis   Thyroid disease Mother    Cancer Mother        liver, hodgkins, skin   Thyroid disease Sister        after radiation for hodgkins   Breast cancer Sister    Anesthesia problems Neg Hx    Hypotension Neg Hx    Pseudochol deficiency Neg Hx    Malignant hyperthermia Neg Hx     Current Outpatient  Medications  Medication Sig Dispense Refill   acyclovir (ZOVIRAX) 400 MG tablet Take 400 mg by mouth 2 (two) times daily.      cholecalciferol (VITAMIN D) 1000 UNITS tablet Take 2,000 Units by mouth daily.     cycloSPORINE modified (NEORAL) 100 MG capsule Take 125 mg by mouth 2 (two) times daily.     fish oil-omega-3 fatty acids 1000 MG capsule Take 2 g by mouth daily.     Magnesium 200 MG TABS Take by mouth.     metoprolol succinate (TOPROL-XL) 25 MG 24 hr tablet TAKE 1 TABLET BY MOUTH EVERY DAY 90 tablet 3   mycophenolate (CELLCEPT) 500 MG tablet Take 500 mg by mouth 2 (two) times daily.     patiromer (VELTASSA) 8.4 g packet Take by mouth.     pravastatin (PRAVACHOL) 10 MG tablet Take 10 mg by mouth daily.     predniSONE (DELTASONE) 5 MG tablet Take 5 mg by mouth daily.     Probiotic Product (PROBIOTIC DAILY PO) Take 1 capsule by mouth daily.     sulfamethoxazole-trimethoprim (BACTRIM,SEPTRA) 400-80 MG per tablet Take 1 tablet by mouth every Monday, Wednesday, and Friday.     chlorthalidone (HYGROTON) 25 MG tablet Take 1 tablet (25 mg total) by mouth daily. 90 tablet 0   No current facility-administered medications for this visit.    Allergies  Allergen Reactions   Amlodipine     Leg swelling   Lisinopril     Hyperkalemia   Rapamune [Sirolimus]     REVIEW OF SYSTEMS (negative unless checked):   Cardiac:  '[]'$  Chest pain or chest pressure? '[]'$  Shortness of breath upon activity? '[]'$  Shortness of breath when lying flat? '[]'$  Irregular heart rhythm?  Vascular:  '[]'$  Pain in calf, thigh, or hip brought on by walking? '[]'$  Pain in feet at night that wakes you up from your sleep? '[]'$  Blood clot in your veins? '[x]'$  Leg swelling?  Pulmonary:  '[]'$  Oxygen at home? '[]'$  Productive cough? '[]'$  Wheezing?  Neurologic:  '[]'$  Sudden weakness in arms or legs? '[]'$  Sudden numbness in arms or legs? '[]'$  Sudden onset of difficult speaking or slurred speech? '[]'$  Temporary loss of vision in one eye? '[]'$   Problems with dizziness?  Gastrointestinal:  '[]'$  Blood in stool? '[]'$  Vomited blood?  Genitourinary:  '[]'$  Burning when urinating? '[]'$   Blood in urine?  Psychiatric:  '[]'$  Major depression  Hematologic:  '[]'$  Bleeding problems? '[]'$  Problems with blood clotting?  Dermatologic:  '[]'$  Rashes or ulcers?  Constitutional:  '[]'$  Fever or chills?  Ear/Nose/Throat:  '[]'$  Change in hearing? '[]'$  Nose bleeds? '[]'$  Sore throat?  Musculoskeletal:  '[]'$  Back pain? '[]'$  Joint pain? '[]'$  Muscle pain?   Physical Examination     Vitals:   11/24/20 1422  BP: (!) 156/91  Pulse: 66  Resp: 20  Temp: 98.2 F (36.8 C)  TempSrc: Temporal  SpO2: 98%  Weight: 177 lb 14.4 oz (80.7 kg)  Height: '5\' 6"'$  (1.676 m)   Body mass index is 28.71 kg/m.  General:  WDWN in NAD; vital signs documented above Gait: Normal HENT: WNL, normocephalic Pulmonary: normal non-labored breathing , without wheezing Cardiac: regular HR, without  Murmurs without carotid bruit Vascular Exam/Pulses:2+ popliteal, 2+ pedal pulses, feet warm and well perfused Extremities: without varicose veins, with reticular veins and spider veins of RLE > LLE, without edema, without stasis pigmentation, without lipodermatosclerosis, without ulcers Musculoskeletal: no muscle wasting or atrophy  Neurologic: A&O X 3;   No focal weakness or paresthesias are detected Psychiatric:  The pt has Normal affect.  Non-invasive Vascular Imaging   BLE Venous Insufficiency Duplex (11/24/20):  RLE:  No DVT and SVT GSV reflux SFJ, proximal thigh to proximal calf GSV diameter 0.44-0.51 SSV reflux mid calf and perforator No deep venous reflux   Medical Decision Making   Krista Ellison is a 65 y.o. female who presents with: RLE chronic venous insufficiency with pain and swelling. Duplex today shows no DVT or SVT. She does have reflux in the right GSV from SFJ to proximal calf. No deep reflux. She does have reflux in mid SSV. Based on the patient's history  and examination, I think she would be a good candidate for venous lazer ablation. Her GSV is large enough to be considered for this.  In the mean time I recommend compression stockings, proper elevation, exercise therapy, and refraining from prolonged sitting or standing.  I discussed with the patient the use of her 20-30 mm thigh high compression stockings and need for 3 month trial of such. She was measured for these today and she purchased a pair. The patient will follow up in 3 months with Dr. Edwena Bunde, PA-C [facility] Office: [phone]  11/24/2020, 3:07 PM  Clinic MD: cain/ Scot Dock

## 2021-03-02 ENCOUNTER — Ambulatory Visit: Payer: PRIVATE HEALTH INSURANCE | Admitting: Vascular Surgery

## 2021-04-06 DIAGNOSIS — I8393 Asymptomatic varicose veins of bilateral lower extremities: Secondary | ICD-10-CM

## 2021-04-13 ENCOUNTER — Ambulatory Visit: Payer: PRIVATE HEALTH INSURANCE | Admitting: Vascular Surgery

## 2021-09-29 ENCOUNTER — Ambulatory Visit (HOSPITAL_COMMUNITY)
Admission: EM | Admit: 2021-09-29 | Discharge: 2021-09-29 | Disposition: A | Discharge status: Home / Self Care | Payer: PRIVATE HEALTH INSURANCE | Attending: Student | Admitting: Student

## 2021-09-29 ENCOUNTER — Ambulatory Visit (INDEPENDENT_AMBULATORY_CARE_PROVIDER_SITE_OTHER): Payer: PRIVATE HEALTH INSURANCE

## 2021-09-29 ENCOUNTER — Encounter (HOSPITAL_COMMUNITY): Payer: Self-pay

## 2021-09-29 DIAGNOSIS — Z942 Lung transplant status: Secondary | ICD-10-CM

## 2021-09-29 DIAGNOSIS — J9811 Atelectasis: Secondary | ICD-10-CM

## 2021-09-29 DIAGNOSIS — R059 Cough, unspecified: Secondary | ICD-10-CM | POA: Diagnosis not present

## 2021-09-29 MED ORDER — AZITHROMYCIN 250 MG PO TABS: 250.0000 mg | ORAL_TABLET | Freq: Every day | ORAL | 0 refills | Status: AC

## 2021-09-29 MED ORDER — DOXYCYCLINE HYCLATE 100 MG PO CAPS: 100.0000 mg | ORAL_CAPSULE | Freq: Two times a day (BID) | ORAL | 0 refills | Status: AC

## 2021-09-29 NOTE — ED Provider Notes (Signed)
[facility]    CSN: [id number] Arrival date & time: 09/29/21  1511      History   Chief Complaint Chief Complaint  Patient presents with   Cough    HPI Krista Ellison is a 66 y.o. female presenting with cough since 6/22, increasingly productive.  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  She states that she is immunocompromised, but does not require regular inhalers.  Has attempted over-the-counter expectorant during present illness without relief.  States the cough is increasingly productive, but she has not seen the sputum.  Also with postnasal drip.  Denies current dyspnea, chest pain, shortness of breath, dizziness, weakness, fevers.  HPI  Past Medical History:  Diagnosis Date   Alpha-1-antitrypsin deficiency ([place])    s/p double lung transplant   Arthritis    Bronchitis    hx of   Cancer ([place])    skin x 2   COPD (chronic obstructive pulmonary disease) (HCC)    hx of. Had lung transplant   Dizziness    Hyperlipidemia    takes Pravastatin   Hypertension    Takes Lisinopril   Left radial fracture    Pneumonia    hx of    Patient Active Problem List   Diagnosis Date Noted   Neck muscle spasm 11/22/2017   Emphysema due to alpha-1-antitrypsin deficiency ([place]) 08/03/2017   Varicose vein of leg 08/03/2017   Melanoma ([place]) 08/03/2017   Renal insufficiency 08/03/2017   Prediabetes 08/03/2017   Cataract 08/03/2017   History of nonmelanoma skin cancer 08/10/2011   Osteoporosis/osteopenia increased risk 07/04/2011   Essential hypertension 07/04/2011   Hypercholesterolemia 07/04/2011   Immunosuppression ([place]) 06/14/2011   Lung transplant status, bilateral ([place]) 06/22/2000    Past Surgical History:  Procedure Laterality Date   BREAST EXCISIONAL BIOPSY Right    CARDIAC CATHETERIZATION  2000   Clean cath   [place], DOUBLE  2002   WRIST SURGERY  05/2011   radial fx     OB History   No obstetric history on file.      Home Medications    Prior to Admission medications   Medication Sig Start Date End Date Taking? Authorizing Provider  azithromycin (ZITHROMAX Z-PAK) 250 MG tablet Take 1 tablet (250 mg total) by mouth daily. Two pills ('500mg'$ ) day 1. One pill per day ('250mg'$ ) days 2-5. 09/29/21  Yes Hazel Sams, PA-C  doxycycline (VIBRAMYCIN) 100 MG capsule Take 1 capsule (100 mg total) by mouth 2 (two) times daily for 7 days. 09/29/21 10/06/21 Yes Hazel Sams, PA-C  acyclovir (ZOVIRAX) 400 MG tablet Take 400 mg by mouth 2 (two) times daily.     [provider]  chlorthalidone (HYGROTON) 25 MG tablet Take 1 tablet (25 mg total) by mouth daily. Patient not taking: Reported on 09/29/2021 02/11/18 03/13/18  Bufford Lope, DO  cholecalciferol (VITAMIN D) 1000 UNITS tablet Take 2,000 Units by mouth daily.    [provider]  cycloSPORINE modified (NEORAL) 100 MG capsule Take 125 mg by mouth 2 (two) times daily.    [provider]  fish oil-omega-3 fatty acids 1000 MG capsule Take 2 g by mouth daily.    [provider]  Magnesium 200 MG TABS Take by mouth.    [provider]  metoprolol succinate (TOPROL-XL) 25 MG 24 hr tablet TAKE 1 TABLET BY MOUTH EVERY DAY 06/05/18   Bufford Lope, DO  mycophenolate (CELLCEPT) 500 MG tablet Take 500 mg by mouth 2 (two) times daily.    [provider]  patiromer Daryll Drown) 8.4 g packet Take by mouth. 09/03/20   [provider]  pravastatin (PRAVACHOL) 10 MG tablet Take 10 mg by mouth daily.    [provider]  predniSONE (DELTASONE) 5 MG tablet Take 5 mg by mouth daily.    [provider]  Probiotic Product (PROBIOTIC DAILY PO) Take 1 capsule by mouth daily.    [provider]    Family History Family History  Problem Relation Age of Onset   Diabetes Father    Hypertension Father    Asthma Father    Heart disease Father     Hyperlipidemia Father    High blood pressure Father    Kidney disease Father        was on dialysis   Thyroid disease Mother    Cancer Mother        liver, hodgkins, skin   Thyroid disease Sister        after radiation for hodgkins   Breast cancer Sister    Anesthesia problems Neg Hx    Hypotension Neg Hx    Pseudochol deficiency Neg Hx    Malignant hyperthermia Neg Hx     Social History Social History   Tobacco Use   Smoking status: Former    Packs/day: 1.50    Years: 17.00    Total pack years: 25.50    Types: Cigarettes    Quit date: 1999    Years since quitting: 24.5   Smokeless tobacco: Never  Vaping Use   Vaping Use: Never used  Substance Use Topics   Alcohol use: Yes    Alcohol/week: 7.0 standard drinks of alcohol    Types: 7 Standard drinks or equivalent per week   Drug use: No    Comment: remotely in past 1990s marijuana and cocaine, none since.     Allergies   Amlodipine, Lisinopril, and Rapamune [sirolimus]   Review of Systems Review of Systems  Constitutional:  Negative for appetite change, chills and fever.  HENT:  Negative for congestion, ear pain, rhinorrhea, sinus pressure, sinus pain and sore throat.   Eyes:  Negative for redness and visual disturbance.  Respiratory:  Positive for cough. Negative for chest tightness, shortness of breath and wheezing.   Cardiovascular:  Negative for chest pain and palpitations.  Gastrointestinal:  Negative for abdominal pain, constipation, diarrhea, nausea and vomiting.  Genitourinary:  Negative for dysuria, frequency and urgency.  Musculoskeletal:  Negative for myalgias.  Neurological:  Negative for dizziness, weakness and headaches.  Psychiatric/Behavioral:  Negative for confusion.   All other systems reviewed and are negative.    Physical Exam Triage Vital Signs ED Triage Vitals  Enc Vitals Group     BP 09/29/21 1539 (!) 152/89     Pulse Rate 09/29/21 1539 71     Resp 09/29/21 1539 16     Temp  09/29/21 1539 98.3 F (36.8 C)     Temp Source 09/29/21 1539 Oral     SpO2 09/29/21 1539 96 %     Weight --      Height --      Head Circumference --      Peak Flow --      Pain Score 09/29/21 1536 0     Pain Loc --      Pain Edu? --      Excl. in [place]? --  No data found.  Updated Vital Signs BP (!) 152/89 (BP Location: Left Arm)   Pulse 71   Temp 98.3 F (36.8 C) (Oral)   Resp 16   LMP 03/02/2010   SpO2 96%   Visual Acuity Right Eye Distance:   Left Eye Distance:   Bilateral Distance:    Right Eye Near:   Left Eye Near:    Bilateral Near:     Physical Exam Vitals reviewed.  Constitutional:      General: She is not in acute distress.    Appearance: Normal appearance. She is not ill-appearing.  HENT:     Head: Normocephalic and atraumatic.     Right Ear: Tympanic membrane, ear canal and external ear normal. No tenderness. No middle ear effusion. There is no impacted cerumen. Tympanic membrane is not perforated, erythematous, retracted or bulging.     Left Ear: Tympanic membrane, ear canal and external ear normal. No tenderness.  No middle ear effusion. There is no impacted cerumen. Tympanic membrane is not perforated, erythematous, retracted or bulging.     Nose: Nose normal. No congestion.     Mouth/Throat:     Mouth: Mucous membranes are moist.     Pharynx: Uvula midline. No oropharyngeal exudate or posterior oropharyngeal erythema.  Eyes:     Extraocular Movements: Extraocular movements intact.     Pupils: Pupils are equal, round, and reactive to light.  Cardiovascular:     Rate and Rhythm: Normal rate and regular rhythm.     Heart sounds: Normal heart sounds.  Pulmonary:     Effort: Pulmonary effort is normal.     Breath sounds: Normal breath sounds. No decreased breath sounds, wheezing, rhonchi or rales.     Comments: Faint rhonchi lower lung fields  Abdominal:     Palpations: Abdomen is soft.     Tenderness: There is no abdominal tenderness. There is no  guarding or rebound.  Lymphadenopathy:     Cervical: No cervical adenopathy.     Right cervical: No superficial cervical adenopathy.    Left cervical: No superficial cervical adenopathy.  Neurological:     General: No focal deficit present.     Mental Status: She is alert and oriented to person, place, and time.  Psychiatric:        Mood and Affect: Mood normal.        Behavior: Behavior normal.        Thought Content: Thought content normal.        Judgment: Judgment normal.      UC Treatments / Results  Labs (all labs ordered are listed, but only abnormal results are displayed) Labs Reviewed - No data to display  EKG   Radiology DG Chest 2 View  Result Date: 09/29/2021 CLINICAL DATA:  Productive cough for 2 weeks. History of lung transplant. EXAM: CHEST - 2 VIEW COMPARISON:  Chest radiographs and CTA 05/16/2013 FINDINGS: Sequelae of bilateral lung transplant are again identified. The cardiomediastinal silhouette is unchanged with normal heart size. Mild interstitial densities are present in the right greater than left lung bases, and there may also be subtle interstitial density peripherally in the right mid lung. There is no evidence of pulmonary edema, pleural effusion, or pneumothorax. No acute osseous abnormality is seen. IMPRESSION: Mild interstitial densities in the right greater than left lung bases and possibly right midlung which could reflect atelectasis and scarring with the possibility of infection raised on the right. Electronically Signed   By: Seymour Bars.D.  On: 09/29/2021 16:16    Procedures Procedures (including critical care time)  Medications Ordered in UC Medications - No data to display  Initial Impression / Assessment and Plan / UC Course  I have reviewed the triage vital signs and the nursing notes.  Pertinent labs & imaging results that were available during my care of the patient were reviewed by me and considered in my medical decision making  (see chart for details).     This patient is a very pleasant 66 y.o. year old female presenting with R middle lung atelectasis - possibly early pneumonia. Afebrile, nontachy. Hemodynamically stable. Oxygenating comfortably on room air. Immunocompromised related to bilateral lung transplant, but does not require regular inhalers.   CXR - Mild interstitial densities in the right greater than left lung bases and possibly right midlung which could reflect atelectasis and scarring with the possibility of infection raised on the right.  Will cover with doxycycline and azithromycin as below  ED return precautions discussed. Patient verbalizes understanding and agreement.   Coding Level 4 for acute illness with systemic symptoms, and prescription drug management   Final Clinical Impressions(s) / UC Diagnoses   Final diagnoses:  Atelectasis of right lung     Discharge Instructions      -Your xray showed a small area where the lungs aren't inflated as much as normal - possibly early pneumonia.  -Doxycycline twice daily for 7 days.  Make sure to wear sunscreen while spending time outside while on this medication as it can increase your chance of sunburn. You can take this medication with food if you have a sensitive stomach. -Z-pack x5 days -Can continue over-the-counter medications for additional relief.     ED Prescriptions     Medication Sig Dispense Auth. Provider   doxycycline (VIBRAMYCIN) 100 MG capsule Take 1 capsule (100 mg total) by mouth 2 (two) times daily for 7 days. 14 capsule Marin Roberts E, PA-C   azithromycin (ZITHROMAX Z-PAK) 250 MG tablet Take 1 tablet (250 mg total) by mouth daily. Two pills ('500mg'$ ) day 1. One pill per day ('250mg'$ ) days 2-5. 6 tablet Hazel Sams, PA-C      PDMP not reviewed this encounter.   Hazel Sams, PA-C 09/29/21 1631

## 2021-09-29 NOTE — Discharge Instructions (Addendum)
-  Your xray showed a small area where the lungs aren't inflated as much as normal - possibly early pneumonia.  -Doxycycline twice daily for 7 days.  Make sure to wear sunscreen while spending time outside while on this medication as it can increase your chance of sunburn. You can take this medication with food if you have a sensitive stomach. -Z-pack x5 days -Can continue over-the-counter medications for additional relief.

## 2021-09-29 NOTE — ED Triage Notes (Signed)
Pt presents with c/o cough that began June 22. Pt states cough is productive. Pt taking OTC medications. Pt has hx of lung transplant.

## 2022-03-31 DIAGNOSIS — I8393 Asymptomatic varicose veins of bilateral lower extremities: Secondary | ICD-10-CM

## 2022-08-10 ENCOUNTER — Ambulatory Visit
Admission: EM | Admit: 2022-08-10 | Discharge: 2022-08-10 | Disposition: A | Discharge status: Home / Self Care | Payer: Medicare Other

## 2022-08-10 DIAGNOSIS — R229 Localized swelling, mass and lump, unspecified: Secondary | ICD-10-CM

## 2022-08-10 DIAGNOSIS — D849 Immunodeficiency, unspecified: Secondary | ICD-10-CM

## 2022-08-10 HISTORY — DX: Type 2 diabetes mellitus without complications: E11.9

## 2022-08-10 MED ORDER — BACITRACIN ZINC 500 UNIT/GM EX OINT: 1.0000 | TOPICAL_OINTMENT | Freq: Two times a day (BID) | CUTANEOUS | 0 refills | Status: AC

## 2022-08-10 MED ORDER — CEPHALEXIN 500 MG PO CAPS: 500.0000 mg | ORAL_CAPSULE | Freq: Three times a day (TID) | ORAL | 0 refills | Status: AC

## 2022-08-10 NOTE — ED Triage Notes (Signed)
Pt c/o bump/mass to left forearm x 10 days-NAD-steady gait

## 2022-08-10 NOTE — Discharge Instructions (Signed)
Use cephalexin to cover for infection of the skin. Change your dressing 2-3 times daily. Every time you change your dressing, clean the wound gently with warm water and Dial antibacterial soap. Pat the wound dry, let it breathe for roughly an hour before covering it back up. When you reapply a dressing, apply Bacitracin ointment to the wound, then cover with non-stick/non-adherent gauze.  After 5-7 days, the wound should scab over nicely and then you don't have to continue doing dressings.   Follow up with your dermatologist urgently.

## 2022-08-10 NOTE — ED Provider Notes (Signed)
[facility]  Note:  This document was prepared using Conservation officer, historic buildings and may include unintentional dictation errors.  MRN: [id number] DOB: 1955-05-14  Subjective:   Krista Ellison is a 67 y.o. female presenting for 10-day history of rapidly progressing firm mass over the left forearm.  Patient is requesting incision and or excision.  Has a history of different types of skin cancer.  Reports that this is very different for her.  The lesion is not draining, bleeding, warm to the touch.  She does have a dermatologist but generally can get in with them urgently.  She is immunocompromised as she is a transplant patient and is on immunosuppressants.  No current facility-administered medications for this encounter.  Current Outpatient Medications:    azithromycin (ZITHROMAX) 250 MG tablet, Take by mouth., Disp: , Rfl:    LANTUS SOLOSTAR 100 UNIT/ML Solostar Pen, Inject into the skin., Disp: , Rfl:    acyclovir (ZOVIRAX) 400 MG tablet, Take 400 mg by mouth 2 (two) times daily. , Disp: , Rfl:    azithromycin (ZITHROMAX Z-PAK) 250 MG tablet, Take 1 tablet (250 mg total) by mouth daily. Two pills (500mg ) day 1. One pill per day (250mg ) days 2-5., Disp: 6 tablet, Rfl: 0   chlorthalidone (HYGROTON) 25 MG tablet, Take 1 tablet (25 mg total) by mouth daily. (Patient not taking: Reported on 09/29/2021), Disp: 90 tablet, Rfl: 0   cholecalciferol (VITAMIN D) 1000 UNITS tablet, Take 2,000 Units by mouth daily., Disp: , Rfl:    cycloSPORINE modified (NEORAL) 100 MG capsule, Take 125 mg by mouth 2 (two) times daily., Disp: , Rfl:    fish oil-omega-3 fatty acids 1000 MG capsule, Take 2 g by mouth daily., Disp: , Rfl:    Magnesium 200 MG TABS, Take by mouth., Disp: , Rfl:    metoprolol succinate (TOPROL-XL) 25 MG 24 hr tablet, TAKE 1 TABLET BY MOUTH EVERY DAY, Disp: 90 tablet, Rfl: 3   mycophenolate (CELLCEPT) 500 MG tablet, Take 500 mg by mouth 2 (two) times daily.,  Disp: , Rfl:    patiromer (VELTASSA) 8.4 g packet, Take by mouth., Disp: , Rfl:    pravastatin (PRAVACHOL) 10 MG tablet, Take 10 mg by mouth daily., Disp: , Rfl:    predniSONE (DELTASONE) 5 MG tablet, Take 5 mg by mouth daily., Disp: , Rfl:    Probiotic Product (PROBIOTIC DAILY PO), Take 1 capsule by mouth daily., Disp: , Rfl:    sulfamethoxazole-trimethoprim (BACTRIM) 400-80 MG tablet, Take 1 tablet by mouth 3 (three) times a week., Disp: , Rfl:    Allergies  Allergen Reactions   Amlodipine     Leg swelling   Lisinopril     Hyperkalemia   Rapamune [Sirolimus]     Past Medical History:  Diagnosis Date   Alpha-1-antitrypsin deficiency (H[facility])    s/p double lung transplant   Arthritis    Bronchitis    hx of   Cancer ([facility])    skin x 2   COPD (chronic obstructive pulmonary disease) ([facility])    hx of. Had lung transplant   Diabetes mellitus without complication ([facility])    Dizziness    Hyperlipidemia    takes Pravastatin   Hypertension    Takes Lisinopril   Left radial fracture    Pneumonia    hx of     Past Surgical History:  Procedure Laterality Date   BREAST EXCISIONAL BIOPSY Right    CARDIAC CATHETERIZATION  2000  Clean cath   DILATION AND CURETTAGE OF UTERUS     LUNG TRANSPLANT, DOUBLE  2002   WRIST SURGERY  05/2011   radial fx    Family History  Problem Relation Age of Onset   Diabetes Father    Hypertension Father    Asthma Father    Heart disease Father    Hyperlipidemia Father    High blood pressure Father    Kidney disease Father        was on dialysis   Thyroid disease Mother    Cancer Mother        liver, hodgkins, skin   Thyroid disease Sister        after radiation for hodgkins   Breast cancer Sister    Anesthesia problems Neg Hx    Hypotension Neg Hx    Pseudochol deficiency Neg Hx    Malignant hyperthermia Neg Hx     Social History   Tobacco Use   Smoking status: Former    Packs/day: 1.50    Years: 17.00    Additional pack years: 0.00     Total pack years: 25.50    Types: Cigarettes    Quit date: 1999    Years since quitting: 25.3   Smokeless tobacco: Never  Vaping Use   Vaping Use: Never used  Substance Use Topics   Alcohol use: Yes    Comment: occ   Drug use: No    ROS   Objective:   Vitals: BP (!) 184/92 (BP Location: Right Arm)   Pulse 66   Temp 98.5 F (36.9 C) (Oral)   Resp 20   LMP 03/02/2010   SpO2 94%   Physical Exam Constitutional:      General: She is not in acute distress.    Appearance: Normal appearance. She is well-developed. She is not ill-appearing, toxic-appearing or diaphoretic.  HENT:     Head: Normocephalic and atraumatic.     Nose: Nose normal.     Mouth/Throat:     Mouth: Mucous membranes are moist.  Eyes:     General: No scleral icterus.       Right eye: No discharge.        Left eye: No discharge.     Extraocular Movements: Extraocular movements intact.  Cardiovascular:     Rate and Rhythm: Normal rate.  Pulmonary:     Effort: Pulmonary effort is normal.  Skin:    General: Skin is warm and dry.       Neurological:     General: No focal deficit present.     Mental Status: She is alert and oriented to person, place, and time.  Psychiatric:        Mood and Affect: Mood normal.        Behavior: Behavior normal.             PROCEDURE NOTE: mass excision Verbal consent obtained. Local anesthesia with 3cc of 2% lidocaine with epinephrine. Sterile prep and drape. An 1/2cm incision was made extending an 11 blade.  No drainage was noted.  Blunt dissection was used around the base of to remove it in its entirety.  Wound cleansed and dressed.  Assessment and Plan :   PDMP not reviewed this encounter.  1. Localized skin mass, lump, or swelling   2. Immunosuppression ([facility]CC)     Prior to the procedure, had an extensive discussion with patient about the implications of excising the mass without a biopsy.  Patient verbalizes understanding that  it is not recommended  to excise without biopsy for cancerous lesions.  I did discuss that this is likely a new lesion given the 10-day history, its general nature of being a mobile mass.  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  She is to follow-up with her dermatologist soon as possible.  Recommended antibiotic prophylaxis given that she is immunocompromise.  Discussed general wound care. Counseled patient on potential for adverse effects with medications prescribed/recommended today, ER and return-to-clinic precautions discussed, patient verbalized understanding.    Wallis Bamberg, [place] 08/10/22 1243

## 2023-02-26 DIAGNOSIS — I8393 Asymptomatic varicose veins of bilateral lower extremities: Secondary | ICD-10-CM

## 2023-12-26 DIAGNOSIS — M7989 Other specified soft tissue disorders: Secondary | ICD-10-CM
